# Patient Record
Sex: Female | Born: 1935 | Race: Black or African American | Hispanic: No | Smoking: Former smoker
Health system: Southern US, Community
[De-identification: ages and names within clinical notes are randomized; demographics above are authoritative.]

## PROBLEM LIST (undated history)

## (undated) DIAGNOSIS — R5383 Other fatigue: Secondary | ICD-10-CM

## (undated) DIAGNOSIS — I10 Essential (primary) hypertension: Secondary | ICD-10-CM

## (undated) DIAGNOSIS — N189 Chronic kidney disease, unspecified: Secondary | ICD-10-CM

## (undated) DIAGNOSIS — Z90710 Acquired absence of both cervix and uterus: Secondary | ICD-10-CM

## (undated) DIAGNOSIS — E785 Hyperlipidemia, unspecified: Secondary | ICD-10-CM

## (undated) DIAGNOSIS — Z9289 Personal history of other medical treatment: Secondary | ICD-10-CM

## (undated) DIAGNOSIS — C50919 Malignant neoplasm of unspecified site of unspecified female breast: Secondary | ICD-10-CM

## (undated) DIAGNOSIS — R001 Bradycardia, unspecified: Secondary | ICD-10-CM

## (undated) DIAGNOSIS — F418 Other specified anxiety disorders: Secondary | ICD-10-CM

## (undated) DIAGNOSIS — K219 Gastro-esophageal reflux disease without esophagitis: Secondary | ICD-10-CM

## (undated) DIAGNOSIS — K5790 Diverticulosis of intestine, part unspecified, without perforation or abscess without bleeding: Secondary | ICD-10-CM

## (undated) HISTORY — DX: Bradycardia, unspecified: R00.1

## (undated) HISTORY — DX: Other fatigue: R53.83

## (undated) HISTORY — DX: Other specified anxiety disorders: F41.8

## (undated) HISTORY — DX: Hyperlipidemia, unspecified: E78.5

## (undated) HISTORY — DX: Essential (primary) hypertension: I10

## (undated) HISTORY — DX: Diverticulosis of intestine, part unspecified, without perforation or abscess without bleeding: K57.90

## (undated) HISTORY — DX: Personal history of other medical treatment: Z92.89

## (undated) HISTORY — PX: MASTECTOMY: SHX3

## (undated) HISTORY — DX: Gastro-esophageal reflux disease without esophagitis: K21.9

## (undated) HISTORY — DX: Acquired absence of both cervix and uterus: Z90.710

## (undated) HISTORY — DX: Malignant neoplasm of unspecified site of unspecified female breast: C50.919

## (undated) HISTORY — DX: Chronic kidney disease, unspecified: N18.9

---

## 2009-05-10 ENCOUNTER — Ambulatory Visit: Payer: Self-pay | Admitting: Internal Medicine

## 2009-05-10 DIAGNOSIS — Z8719 Personal history of other diseases of the digestive system: Secondary | ICD-10-CM

## 2009-05-10 DIAGNOSIS — E785 Hyperlipidemia, unspecified: Secondary | ICD-10-CM

## 2009-05-10 DIAGNOSIS — Z9189 Other specified personal risk factors, not elsewhere classified: Secondary | ICD-10-CM | POA: Insufficient documentation

## 2009-05-10 DIAGNOSIS — G43909 Migraine, unspecified, not intractable, without status migrainosus: Secondary | ICD-10-CM | POA: Insufficient documentation

## 2009-05-10 DIAGNOSIS — I10 Essential (primary) hypertension: Secondary | ICD-10-CM | POA: Insufficient documentation

## 2009-05-10 DIAGNOSIS — F329 Major depressive disorder, single episode, unspecified: Secondary | ICD-10-CM

## 2009-05-10 DIAGNOSIS — Z8669 Personal history of other diseases of the nervous system and sense organs: Secondary | ICD-10-CM | POA: Insufficient documentation

## 2009-05-10 DIAGNOSIS — K219 Gastro-esophageal reflux disease without esophagitis: Secondary | ICD-10-CM | POA: Insufficient documentation

## 2009-05-10 DIAGNOSIS — Z853 Personal history of malignant neoplasm of breast: Secondary | ICD-10-CM

## 2009-05-10 DIAGNOSIS — Z87448 Personal history of other diseases of urinary system: Secondary | ICD-10-CM

## 2009-05-10 DIAGNOSIS — N189 Chronic kidney disease, unspecified: Secondary | ICD-10-CM | POA: Insufficient documentation

## 2009-05-10 LAB — CONVERTED CEMR LAB
BUN: 34 mg/dL — ABNORMAL HIGH (ref 6–23)
Cholesterol: 195 mg/dL (ref 0–200)
Creatinine, Ser: 1.4 mg/dL — ABNORMAL HIGH (ref 0.4–1.2)
Eosinophils Relative: 2 % (ref 0.0–5.0)
GFR calc non Af Amer: 46.17 mL/min (ref >60)
HCT: 35.9 % — ABNORMAL LOW (ref 36.0–46.0)
LDL Cholesterol: 106 mg/dL — ABNORMAL HIGH (ref 0–99)
Monocytes Relative: 5.8 % (ref 3.0–12.0)
Neutrophils Relative %: 53.1 % (ref 43.0–77.0)
Platelets: 224 10*3/uL (ref 150.0–400.0)
Potassium: 3.4 meq/L — ABNORMAL LOW (ref 3.5–5.1)
RBC: 3.62 M/uL — ABNORMAL LOW (ref 3.87–5.11)
TSH: 1.07 microintl units/mL (ref 0.35–5.50)
Total CHOL/HDL Ratio: 3
VLDL: 10.8 mg/dL (ref 0.0–40.0)
WBC: 6.1 10*3/uL (ref 4.5–10.5)

## 2009-09-07 ENCOUNTER — Ambulatory Visit: Payer: Self-pay | Admitting: Internal Medicine

## 2009-12-01 ENCOUNTER — Telehealth: Payer: Self-pay | Admitting: Internal Medicine

## 2010-01-05 ENCOUNTER — Ambulatory Visit: Payer: Self-pay | Admitting: Internal Medicine

## 2010-02-07 ENCOUNTER — Ambulatory Visit: Payer: Self-pay | Admitting: Internal Medicine

## 2010-07-19 NOTE — Assessment & Plan Note (Signed)
Summary: NEW / MEDICARE / # CD--switch ok'd/cd   Vital Signs:  Patient profile:   77 year old female Height:      63.5 inches (161.29 cm) Weight:      136 pounds (61.82 kg) BMI:     23.80 O2 Sat:      96 % on Room air Temp:     98.2 degrees F (36.78 degrees C) oral Pulse rate:   62 / minute Pulse rhythm:   regular Resp:     16 per minute BP sitting:   140 / 70  (left arm) Cuff size:   regular  Vitals Entered By: Lucious Groves CMA (February 07, 2010 3:00 PM)  O2 Flow:  Room air CC: New to est./kb Is Patient Diabetic? No Comments Patient denies taking Sertraline./kb   Primary Care Provider:  Etta Grandchild MD  CC:  New to est./kb.  History of Present Illness:  Hypertension Follow-Up      This is an 77 year old woman who presents for Hypertension follow-up.  The patient denies lightheadedness, urinary frequency, headaches, edema, rash, and fatigue.  The patient denies the following associated symptoms: chest pain, chest pressure, exercise intolerance, dyspnea, palpitations, syncope, leg edema, and pedal edema.  Compliance with medications (by patient report) has been near 100%.  The patient reports that dietary compliance has been good.  The patient reports no exercise.  Adjunctive measures currently used by the patient include salt restriction and relaxation.  She saw Dr. Hyman Hopes her kidney doctor last week and she was told that all is ok.  Preventive Screening-Counseling & Management  Alcohol-Tobacco     Alcohol drinks/day: 0     Alcohol Counseling: not indicated; patient does not drink     Smoking Status: quit     Tobacco Counseling: to remain off tobacco products  Hep-HIV-STD-Contraception     Hepatitis Risk: no risk noted     HIV Risk: no risk noted     STD Risk: no risk noted  Current Medications (verified): 1)  Ativan 1 Mg Tabs (Lorazepam) .Marland Kitchen.. 1 By Mouth Two Times A Day As Needed 2)  Ferrous Sulfate 325 (65 Fe) Mg Tabs (Ferrous Sulfate) .... Take 1 By Mouth Qd 3)   Hydrochlorothiazide 25 Mg Tabs (Hydrochlorothiazide) .... Take 1 Po Qd 4)  Pravastatin Sodium 20 Mg Tabs (Pravastatin Sodium) .... Take 1 By Mouth Qd 5)  Gabapentin 600 Mg Tabs (Gabapentin) .... Take 1 Qid 6)  Omeprazole 20 Mg Cpdr (Omeprazole) .Marland Kitchen.. 1 By Mouth Once Daily 7)  Sertraline Hcl 25 Mg Tabs (Sertraline Hcl) .Marland Kitchen.. 1 By Mouth Once Daily  Allergies (verified): No Known Drug Allergies  Past History:  Past Medical History: Last updated: 01/05/2010 Breast cancer, hx of Depression/anxiety Diverticulitis, hx of GERD Hyperlipidemia Hypertension CKD -    physician roster- renal Hyman Hopes  Past Surgical History: Last updated: 05/10/2009 Hysterectomy  Family History: Last updated: 05/10/2009 Heart disease (mom) Family History Hypertension (mom)  Social History: Last updated: 05/10/2009 Former Smoker lives alone; has family in [place] area [facility] since fall 2009 no alcohol  Risk Factors: Alcohol Use: 0 (02/07/2010) Caffeine Use: 1-2 (05/10/2009) Exercise: no (05/10/2009)  Risk Factors: Smoking Status: quit (02/07/2010)  Social History: Hepatitis Risk:  no risk noted HIV Risk:  no risk noted STD Risk:  no risk noted  Review of Systems  The patient denies anorexia, fever, weight loss, weight gain, chest pain, peripheral edema, prolonged cough, headaches, hemoptysis, abdominal  pain, hematuria, suspicious skin lesions, difficulty walking, depression, enlarged lymph nodes, and angioedema.    Physical Exam  General:  alert, well-developed, well-nourished, and cooperative to examination.   slight stuttering quality to speech Head:  normocephalic, atraumatic, no abnormalities observed, and no abnormalities palpated.   Mouth:  pharynx pink and moist, no erythema, and no exudates.   Neck:  supple, full ROM, no masses, no thyromegaly, no JVD, no carotid bruits, no cervical lymphadenopathy, and no neck tenderness.   Lungs:  normal  respiratory effort, no intercostal retractions, no accessory muscle use, normal breath sounds, no dullness, no fremitus, no crackles, and no wheezes.   Heart:  normal rate, regular rhythm, no murmur, no gallop, no rub, and no JVD.   Abdomen:  soft, non-tender, normal bowel sounds, no distention, no masses, no guarding, no rigidity, no rebound tenderness, no abdominal hernia, no inguinal hernia, no hepatomegaly, and no splenomegaly.   Msk:  No deformity or scoliosis noted of thoracic or lumbar spine.   Pulses:  R and L carotid,radial,femoral,dorsalis pedis and posterior tibial pulses are full and equal bilaterally Extremities:  No clubbing, cyanosis, edema, or deformity noted with normal full range of motion of all joints.   Neurologic:  No cranial nerve deficits noted. Station and gait are normal. Plantar reflexes are down-going bilaterally. DTRs are symmetrical throughout. Sensory, motor and coordinative functions appear intact. Skin:  Intact without suspicious lesions or rashes Cervical Nodes:  no anterior cervical adenopathy and no posterior cervical adenopathy.   Psych:  Cognition and judgment appear intact. Alert and cooperative with normal attention span and concentration. No apparent delusions, illusions, hallucinations   Impression & Recommendations:  Problem # 1:  HYPERTENSION (ICD-401.9) Assessment Improved  Her updated medication list for this problem includes:    Hydrochlorothiazide 25 Mg Tabs (Hydrochlorothiazide) .Marland Kitchen... Take 1 po qd  BP today: 140/70 Prior BP: 130/74 (01/05/2010)  Labs Reviewed: K+: 3.4 (05/10/2009) Creat: : 1.4 (05/10/2009)   Chol: 195 (05/10/2009)   HDL: 77.80 (05/10/2009)   LDL: 106 (05/10/2009)   TG: 54.0 (05/10/2009)  Problem # 2:  CHRONIC KIDNEY DISEASE UNSPECIFIED (ICD-585.9) Assessment: Unchanged  Labs Reviewed: BUN: 34 (05/10/2009)   Cr: 1.4 (05/10/2009)    Hgb: 12.1 (05/10/2009)   Hct: 35.9 (05/10/2009)   Ca++: 9.5 (05/10/2009)     Problem #  3:  BREAST CANCER, HX OF (ICD-V10.3) Assessment: Unchanged  Orders: Radiology Referral (Radiology)  Complete Medication List: 1)  Ativan 1 Mg Tabs (Lorazepam) .Marland Kitchen.. 1 by mouth two times a day as needed 2)  Ferrous Sulfate 325 (65 Fe) Mg Tabs (Ferrous sulfate) .... Take 1 by mouth qd 3)  Hydrochlorothiazide 25 Mg Tabs (Hydrochlorothiazide) .... Take 1 po qd 4)  Pravastatin Sodium 20 Mg Tabs (Pravastatin sodium) .... Take 1 by mouth qd 5)  Gabapentin 600 Mg Tabs (Gabapentin) .... Take 1 qid 6)  Omeprazole 20 Mg Cpdr (Omeprazole) .Marland Kitchen.. 1 by mouth once daily 7)  Sertraline Hcl 25 Mg Tabs (Sertraline hcl) .Marland Kitchen.. 1 by mouth once daily  Patient Instructions: 1)  Please schedule a follow-up appointment in 3 months. 2)  It is important that you exercise regularly at least 20 minutes 5 times a week. If you develop chest pain, have severe difficulty breathing, or feel very tired , stop exercising immediately and seek medical attention. 3)  You need to lose weight. Consider a lower calorie diet and regular exercise.  4)  Schedule your mammogram. 5)  Check your Blood Pressure regularly. If it is  above 130/80: you should make an appointment.

## 2010-07-19 NOTE — Assessment & Plan Note (Signed)
Summary: 4 MOS F/U // #/ CD   Vital Signs:  Patient profile:   77 year old female Height:      63.5 inches (161.29 cm) Weight:      134.0 pounds (60.91 kg) O2 Sat:      95 % on Room air Temp:     98.4 degrees F (36.89 degrees C) oral Pulse rate:   70 / minute BP sitting:   130 / 70  (left arm) Cuff size:   regular  Vitals Entered By: Orlan Leavens (September 07, 2009 11:15 AM)  O2 Flow:  Room air CC: 4 month follow-up/ req refill son HCTZ and pravastatin Is Patient Diabetic? No Pain Assessment Patient in pain? no        Primary Care Provider:  Newt Lukes MD  CC:  4 month follow-up/ req refill son HCTZ and pravastatin.  History of Present Illness: here for followup-  1) CKD - also follows with dr. Hyman Hopes for same - no plans for HD or need or replacement tx in past reports compliance with ongoing medical treatment and no changes in medication dose or frequency. denies adverse side effects related to current therapy.   2) HTN -  reports compliance with ongoing medical treatment and no changes in medication dose or frequency. denies adverse side effects related to current therapy. meds have been managed by renal to date  3) dyslipdemia - reports compliance with ongoing medical treatment and no changes in medication dose or frequency. denies adverse side effects related to current therapy.   4)GERD - increase in burning symptoms - not on med tx but tried something w/o change in past - no dysphagia or pain swallow - no abd pain  6) depression -  takes ativan for same because "nerves makes me depressed if not on ativan" "other medicines don't help as much so i only take ativan" denies insomnia or feelings of sadness, no SI/HI   Current Medications (verified): 1)  Ferrous Sulfate 325 (65 Fe) Mg Tabs (Ferrous Sulfate) .... Take 1 By Mouth Qd 2)  Ativan 1 Mg Tabs (Lorazepam) .... Take 1 By Mouth Two Times A Day As Needed - 3)  Zemplar 1 Mcg Caps (Paricalcitol) .... Take  1 Mon, Wed,and Fri 4)  Hydrochlorothiazide 25 Mg Tabs (Hydrochlorothiazide) .... Take 1 Po Qd 5)  Pravastatin Sodium 20 Mg Tabs (Pravastatin Sodium) .... Take 1 By Mouth Qd 6)  Gabapentin 600 Mg Tabs (Gabapentin) .... Take 1 Qid  Allergies (verified): No Known Drug Allergies  Past History:  Past Medical History: Breast cancer, hx of Depression Diverticulitis, hx of GERD Hyperlipidemia Hypertension CKD -   physician rooster- renal - Hyman Hopes  Review of Systems  The patient denies fever, hoarseness, chest pain, prolonged cough, headaches, and abdominal pain.    Physical Exam  General:  alert, well-developed, well-nourished, and cooperative to examination.   slight stuttering quality to speech Lungs:  normal respiratory effort, no intercostal retractions or use of accessory muscles; normal breath sounds bilaterally - no crackles and no wheezes.    Heart:  normal rate, regular rhythm, no murmur, and no rub. BLE without edema.  Neurologic:  alert & oriented X3 and cranial nerves II-XII symetrically intact.  strength normal in all extremities, sensation intact to light touch, and gait normal. speech fluent without dysarthria or aphasia; follows commands with good comprehension.  Psych:  Oriented X3, memory intact for recent and remote, normally interactive, good eye contact, not anxious appearing, not depressed appearing, and  not agitated.      Impression & Recommendations:  Problem # 1:  GERD (ICD-530.81)  start new PPI tx for same - reassured exam looks good today Her updated medication list for this problem includes:    Omeprazole 20 Mg Cpdr (Omeprazole) .Marland Kitchen... 1 by mouth once daily  Labs Reviewed: Hgb: 12.1 (05/10/2009)   Hct: 35.9 (05/10/2009)  Orders: Prescription Created Electronically [phone])  Problem # 2:  CHRONIC KIDNEY DISEASE UNSPECIFIED (ICD-585.9)  followed by renal for same - check labs - cont zeplar and f/u with dr. Hyman Hopes -   Labs Reviewed: BUN: 34  (05/10/2009)   Cr: 1.4 (05/10/2009)    Hgb: 12.1 (05/10/2009)   Hct: 35.9 (05/10/2009)   Ca++: 9.5 (05/10/2009)     Problem # 3:  HYPERTENSION (ICD-401.9)  Her updated medication list for this problem includes:    Hydrochlorothiazide 25 Mg Tabs (Hydrochlorothiazide) .Marland Kitchen... Take 1 po qd  BP today: 130/70 Prior BP: 132/72 (05/10/2009)  Labs Reviewed: K+: 3.4 (05/10/2009) Creat: : 1.4 (05/10/2009)   Chol: 195 (05/10/2009)   HDL: 77.80 (05/10/2009)   LDL: 106 (05/10/2009)   TG: 54.0 (05/10/2009)  Problem # 4:  HYPERLIPIDEMIA (ICD-272.4)  Her updated medication list for this problem includes:    Pravastatin Sodium 20 Mg Tabs (Pravastatin sodium) .Marland Kitchen... Take 1 by mouth qd    HDL:77.80 (05/10/2009)  LDL:106 (05/10/2009)  Chol:195 (05/10/2009)  Trig:54.0 (05/10/2009)  Problem # 5:  DEPRESSION (ICD-311)  The following medications were removed from the medication list:    Ativan 1 Mg Tabs (Lorazepam) .Marland Kitchen... Take 1 by mouth two times a day as needed - Her updated medication list for this problem includes:    Ativan 1 Mg Tabs (Lorazepam) .Marland Kitchen... 1 by mouth two times a day as needed  Complete Medication List: 1)  Ativan 1 Mg Tabs (Lorazepam) .Marland Kitchen.. 1 by mouth two times a day as needed 2)  Ferrous Sulfate 325 (65 Fe) Mg Tabs (Ferrous sulfate) .... Take 1 by mouth qd 3)  Zemplar 1 Mcg Caps (Paricalcitol) .... Take 1 mon, wed,and fri 4)  Hydrochlorothiazide 25 Mg Tabs (Hydrochlorothiazide) .... Take 1 po qd 5)  Pravastatin Sodium 20 Mg Tabs (Pravastatin sodium) .... Take 1 by mouth qd 6)  Gabapentin 600 Mg Tabs (Gabapentin) .... Take 1 qid 7)  Omeprazole 20 Mg Cpdr (Omeprazole) .Marland Kitchen.. 1 by mouth once daily  Patient Instructions: 1)  it was good to see you today.  2)  will start medication for your reflux symptoms - omeprazole 3)  your prescriptions and refills have been electronically submitted to your pharmacy. Please take as directed. Contact our office if you believe you're having problems with  the medication(s).  4)  Please schedule a follow-up appointment in 4 months to monitor labs, sooner if problems.  Prescriptions: ATIVAN 1 MG TABS (LORAZEPAM) take 1 by mouth two times a day as needed -  #60 x 3   Entered and Authorized by:   Newt Lukes MD   Signed by:   Newt Lukes MD on 09/07/2009   Method used:   Print then Give to Patient   RxID:   [id number] OMEPRAZOLE 20 MG CPDR (OMEPRAZOLE) 1 by mouth once daily  #30 x 3   Entered and Authorized by:   Newt Lukes MD   Signed by:   Newt Lukes MD on 09/07/2009   Method used:   Electronically to        [facility] #[id number]* (retail)  [address]       [place], [place]  [address]       Ph: [phone]       Fax: [phone]   RxID:   [id number] PRAVASTATIN SODIUM 20 MG TABS (PRAVASTATIN SODIUM) take 1 by mouth qd  #30 x 6   Entered by:   Orlan Leavens   Authorized by:   Newt Lukes MD   Signed by:   Orlan Leavens on 09/07/2009   Method used:   Electronically to        [facility] #[id number]* (retail)       [address] [place], [place]  [address]       Ph: [phone]       Fax: [phone]   RxID:   [id number] HYDROCHLOROTHIAZIDE 25 MG TABS (HYDROCHLOROTHIAZIDE) take 1 po qd  #30 x 6   Entered by:   Orlan Leavens   Authorized by:   Newt Lukes MD   Signed by:   Orlan Leavens on 09/07/2009   Method used:   Electronically to        [facility] #[id number]* (retail)       [address] [place], [place]  [address]       Ph: [phone]       Fax: [phone]   RxID:   [id number]

## 2010-07-19 NOTE — Assessment & Plan Note (Signed)
Summary: 4 MTH FU--STC   Vital Signs:  Patient profile:   77 year old female Height:      63.5 inches (161.29 cm) Weight:      136.2 pounds (61.91 kg) O2 Sat:      95 % on Room air Temp:     98.2 degrees F (36.78 degrees C) oral Pulse rate:   73 / minute BP sitting:   130 / 74  (left arm) Cuff size:   regular  Vitals Entered By: Orlan Leavens (January 05, 2010 11:10 AM)  O2 Flow:  Room air CC: 4 month follow-up Is Patient Diabetic? No Pain Assessment Patient in pain? no      Comments Req refill on omeprazole   Primary Care Provider:  Newt Lukes MD  CC:  4 month follow-up.  History of Present Illness: here for followup-  1) CKD - also follows with dr. Hyman Hopes for same - no plans for HD or need or replacement tx in past reports compliance with ongoing medical treatment and no changes in medication dose or frequency. denies adverse side effects related to current therapy.   2) HTN -  reports compliance with ongoing medical treatment and no changes in medication dose or frequency. denies adverse side effects related to current therapy. meds have been managed by renal to date  3) dyslipdemia - reports compliance with ongoing medical treatment and no changes in medication dose or frequency. denies adverse side effects related to current therapy.   4) GERD - improved burning symptoms on PPI - no dysphagia or pain swallow - no abd pain  6) depression/anxiety - takes ativan for same because "nerves makes me depressed if not on ativan" "other medicines don't help as much so i only take ativan" denies insomnia or feelings of sadness, no SI/HI - has been in mental health hospital for same wants brand name only because generic doesn't work -  no local mental health provider yet est but followed reg with same in [place]  Clinical Review Panels:  Lipid Management   Cholesterol:  195 (05/10/2009)   LDL (bad choesterol):  106 (05/10/2009)   HDL (good cholesterol):  77.80  (05/10/2009)  CBC   WBC:  6.1 (05/10/2009)   RBC:  3.62 (05/10/2009)   Hgb:  12.1 (05/10/2009)   Hct:  35.9 (05/10/2009)   Platelets:  224.0 (05/10/2009)   MCV  99.2 (05/10/2009)   MCHC  33.7 (05/10/2009)   RDW  12.4 (05/10/2009)   PMN:  53.1 (05/10/2009)   Lymphs:  38.1 (05/10/2009)   Monos:  5.8 (05/10/2009)   Eosinophils:  2.0 (05/10/2009)   Basophil:  1.0 (05/10/2009)  Complete Metabolic Panel   Glucose:  103 (05/10/2009)   Sodium:  141 (05/10/2009)   Potassium:  3.4 (05/10/2009)   Chloride:  100 (05/10/2009)   CO2:  32 (05/10/2009)   BUN:  34 (05/10/2009)   Creatinine:  1.4 (05/10/2009)   Calcium:  9.5 (05/10/2009)   Current Medications (verified): 1)  Ativan 1 Mg Tabs (Lorazepam) .Marland Kitchen.. 1 By Mouth Two Times A Day As Needed 2)  Ferrous Sulfate 325 (65 Fe) Mg Tabs (Ferrous Sulfate) .... Take 1 By Mouth Qd 3)  Hydrochlorothiazide 25 Mg Tabs (Hydrochlorothiazide) .... Take 1 Po Qd 4)  Pravastatin Sodium 20 Mg Tabs (Pravastatin Sodium) .... Take 1 By Mouth Qd 5)  Gabapentin 600 Mg Tabs (Gabapentin) .... Take 1 Qid 6)  Omeprazole 20 Mg Cpdr (Omeprazole) .Marland Kitchen.. 1 By Mouth Once Daily  Allergies (verified): No  Known Drug Allergies  Past History:  Past Medical History: Breast cancer, hx of Depression/anxiety Diverticulitis, hx of GERD Hyperlipidemia Hypertension CKD -    physician roster- renal - Hyman Hopes  Review of Systems  The patient denies fever, weight loss, hoarseness, chest pain, syncope, and abdominal pain.    Physical Exam  General:  alert, well-developed, well-nourished, and cooperative to examination.   slight stuttering quality to speech Lungs:  normal respiratory effort, no intercostal retractions or use of accessory muscles; normal breath sounds bilaterally - no crackles and no wheezes.    Heart:  normal rate, regular rhythm, no murmur, and no rub. BLE without edema.  Psych:  Oriented X3, memory intact for recent and remote, normally interactive, good  eye contact, not anxious appearing, not depressed appearing, and not agitated.      Impression & Recommendations:  Problem # 1:  DEPRESSION (ICD-311)  Her updated medication list for this problem includes:    Ativan 1 Mg Tabs (Lorazepam) .Marland Kitchen... 1 by mouth two times a day as needed    Sertraline Hcl 25 Mg Tabs (Sertraline hcl) .Marland Kitchen... 1 by mouth once daily  will prescribe brand name as per pt request despite arguement that i believe generic works fine - given one month and 2 refills and DAW until pt est with mental health local provider  Orders: Psychiatric Referral (Psych)  Problem # 2:  HYPERTENSION (ICD-401.9)  Her updated medication list for this problem includes:    Hydrochlorothiazide 25 Mg Tabs (Hydrochlorothiazide) .Marland Kitchen... Take 1 po qd  BP today: 130/74 Prior BP: 130/70 (09/07/2009)  Labs Reviewed: K+: 3.4 (05/10/2009) Creat: : 1.4 (05/10/2009)   Chol: 195 (05/10/2009)   HDL: 77.80 (05/10/2009)   LDL: 106 (05/10/2009)   TG: 54.0 (05/10/2009)  Problem # 3:  HYPERLIPIDEMIA (ICD-272.4)  Her updated medication list for this problem includes:    Pravastatin Sodium 20 Mg Tabs (Pravastatin sodium) .Marland Kitchen... Take 1 by mouth qd    HDL:77.80 (05/10/2009)  LDL:106 (05/10/2009)  Chol:195 (05/10/2009)  Trig:54.0 (05/10/2009)  Problem # 4:  CHRONIC KIDNEY DISEASE UNSPECIFIED (ICD-585.9)  followed by renal for same - check labs - cont zeplar and f/u with dr. Hyman Hopes -   Labs Reviewed: BUN: 34 (05/10/2009)   Cr: 1.4 (05/10/2009)    Hgb: 12.1 (05/10/2009)   Hct: 35.9 (05/10/2009)   Ca++: 9.5 (05/10/2009)     Problem # 5:  GERD (ICD-530.81)  Her updated medication list for this problem includes:    Omeprazole 20 Mg Cpdr (Omeprazole) .Marland Kitchen... 1 by mouth once daily  Labs Reviewed: Hgb: 12.1 (05/10/2009)   Hct: 35.9 (05/10/2009)  cont PPI tx for same - reassured exam looks good today  Complete Medication List: 1)  Ativan 1 Mg Tabs (Lorazepam) .Marland Kitchen.. 1 by mouth two times a day as needed 2)   Ferrous Sulfate 325 (65 Fe) Mg Tabs (Ferrous sulfate) .... Take 1 by mouth qd 3)  Hydrochlorothiazide 25 Mg Tabs (Hydrochlorothiazide) .... Take 1 po qd 4)  Pravastatin Sodium 20 Mg Tabs (Pravastatin sodium) .... Take 1 by mouth qd 5)  Gabapentin 600 Mg Tabs (Gabapentin) .... Take 1 qid 6)  Omeprazole 20 Mg Cpdr (Omeprazole) .Marland Kitchen.. 1 by mouth once daily 7)  Sertraline Hcl 25 Mg Tabs (Sertraline hcl) .Marland Kitchen.. 1 by mouth once daily  Patient Instructions: 1)  it was good to see you today.  2)  continue medication for your reflux symptoms - omeprazole 3)  presciption for brand name ativan given -  4)  also  start sertraline for anxiety 5)  your prescriptions have been electronically submitted to your pharmacy. Please take as directed. Contact our office if you believe you're having problems with the medication(s).  6)  we'll make referral to local mental health provider. Our office will contact you regarding this appointment once made.  7)  Please schedule a follow-up appointment in 4 months to monitor labs, sooner if problems.  Prescriptions: SERTRALINE HCL 25 MG TABS (SERTRALINE HCL) 1 by mouth once daily  #30 x 3   Entered and Authorized by:   Newt Lukes MD   Signed by:   Newt Lukes MD on 01/05/2010   Method used:   Electronically to        [facility] #[id number]* (retail)       [address] [place], [place]  [address]       Ph: [phone]       Fax: [phone]   RxID:   [id number] ATIVAN 1 MG TABS (LORAZEPAM) 1 by mouth two times a day as needed Brand medically necessary #60 x 2   Entered and Authorized by:   Newt Lukes MD   Signed by:   Newt Lukes MD on 01/05/2010   Method used:   Print then Give to Patient   RxID:   [id number] OMEPRAZOLE 20 MG CPDR (OMEPRAZOLE) 1 by mouth once daily  #30 x 3   Entered and Authorized by:   Newt Lukes MD   Signed by:   Newt Lukes MD on 01/05/2010   Method used:   Electronically to          [facility] #[id number]* (retail)       [address] [place], [place]  [address]       Ph: [phone]       Fax: [phone]   RxID:   [id number]

## 2010-07-19 NOTE — Progress Notes (Signed)
Summary: Lorazepam BMN?  Phone Note From Pharmacy   Caller: [facility] #[id number]* Summary of Call: Pharmacy called stating that pt is requesting Brand Name Lorazepam, which would require Medicare to pay for medication. If MD okays, hard copy stating "Brand Name Medically Necessary" on hard copy to be faxed to pharmacy. Initial call taken by: Margaret Pyle, CMA,  December 01, 2009 2:42 PM  Follow-up for Phone Call        no - Follow-up by: Newt Lukes MD,  December 01, 2009 4:12 PM  Additional Follow-up for Phone Call Additional follow up Details #1::        pharmacist Dahlia Client informed Additional Follow-up by: Margaret Pyle, CMA,  December 01, 2009 4:47 PM

## 2010-07-19 NOTE — Miscellaneous (Signed)
Summary: [facility]   Imported By: Lester [place] 09/17/2009 08:04:26  _____________________________________________________________________  External Attachment:    Type:   Image     Comment:   External Document

## 2011-02-21 ENCOUNTER — Encounter: Payer: Self-pay | Admitting: Cardiovascular Disease

## 2011-02-28 ENCOUNTER — Encounter: Payer: Self-pay | Admitting: Cardiovascular Disease

## 2011-03-20 ENCOUNTER — Encounter: Payer: Self-pay | Admitting: Cardiovascular Disease

## 2011-03-22 ENCOUNTER — Ambulatory Visit: Payer: Self-pay | Admitting: Cardiovascular Disease

## 2011-04-05 ENCOUNTER — Ambulatory Visit (INDEPENDENT_AMBULATORY_CARE_PROVIDER_SITE_OTHER): Payer: Medicare Other | Admitting: Cardiovascular Disease

## 2011-04-05 ENCOUNTER — Encounter: Payer: Self-pay | Admitting: Cardiovascular Disease

## 2011-04-05 VITALS — BP 140/73 | HR 77 | Ht 60.0 in | Wt 134.0 lb

## 2011-04-05 DIAGNOSIS — R001 Bradycardia, unspecified: Secondary | ICD-10-CM

## 2011-04-05 DIAGNOSIS — I498 Other specified cardiac arrhythmias: Secondary | ICD-10-CM

## 2011-04-05 NOTE — Patient Instructions (Signed)
Your physician has recommended that you wear a 24 holter monitor. Holter monitors are medical devices that record the heart's electrical activity. Doctors most often use these monitors to diagnose arrhythmias. Arrhythmias are problems with the speed or rhythm of the heartbeat. The monitor is a small, portable device. You can wear one while you do your normal daily activities. This is usually used to diagnose what is causing palpitations/syncope (passing out).  Your physician recommends that you continue on your current medications as directed. Please refer to the Current Medication list given to you today.  Your physician recommends that you schedule a follow-up appointment as needed.

## 2011-04-07 ENCOUNTER — Telehealth: Payer: Self-pay | Admitting: Cardiovascular Disease

## 2011-04-07 NOTE — Telephone Encounter (Signed)
Order was placed for holter monitor.  I will forward this message to Windell Moulding to contact the pt about appointment.

## 2011-04-07 NOTE — Telephone Encounter (Signed)
Pt calling to speak w/ nurse about some dates when pt can come in monitor, pt thinks. Please return pt call to clarify/discuss further.

## 2011-04-12 ENCOUNTER — Encounter (INDEPENDENT_AMBULATORY_CARE_PROVIDER_SITE_OTHER): Payer: Medicare Other

## 2011-04-12 DIAGNOSIS — R001 Bradycardia, unspecified: Secondary | ICD-10-CM

## 2011-04-12 DIAGNOSIS — I498 Other specified cardiac arrhythmias: Secondary | ICD-10-CM

## 2011-04-19 DIAGNOSIS — R001 Bradycardia, unspecified: Secondary | ICD-10-CM | POA: Insufficient documentation

## 2011-04-19 NOTE — Assessment & Plan Note (Signed)
Your. Her generalized fatigue is probably multifactorial. However, she did have marked bradycardia noted that her previous office visit and I think it is reasonable to pursue a 24-hour Holter monitor to evaluate for arrhythmia or significant bradycardic events. Her physical exam is essentially normal and there is no sign of structural heart disease. Laboratory data was reviewed and she's had a recent TSH which was within normal limits.  I will review her Holter monitor when available and likely will see her back on an as needed basis unless the Holter monitor demonstrates significant arrhythmia.

## 2011-04-19 NOTE — Progress Notes (Signed)
HPI:  This is an 77 year old woman presenting for evaluation of bradycardia and fatigue. The patient is followed by Dr. Hyman Hopes for chronic kidney disease. Her creatinine has been stable in the range of 1.3-1.5 mg per deciliter. At the time of her evaluation last month she was noted to have a heart rate of 48. She has complained of generalized fatigue slowly progressive over the last few years. She denies palpitations, lightheadedness, or syncope. The patient has chronic left-sided chest pain that she relates to postherpetic neuralgia and she has been dealing with this for over 30 years. She has no exertional chest tightness. She denies dyspnea, edema, orthopnea, or PND.  Outpatient Encounter Prescriptions as of 04/05/2011  Medication Sig Dispense Refill  . amLODipine (NORVASC) 2.5 MG tablet Take 2.5 mg by mouth daily.        Marland Kitchen gabapentin (NEURONTIN) 600 MG tablet Take 600 mg by mouth 4 (four) times daily.        . hydrochlorothiazide (HYDRODIURIL) 25 MG tablet Take 25 mg by mouth daily.        Marland Kitchen LORazepam (ATIVAN) 1 MG tablet Take 1 mg by mouth every 6 (six) hours as needed.        Marland Kitchen omeprazole (PRILOSEC OTC) 20 MG tablet Take 20 mg by mouth daily.        . rosuvastatin (CRESTOR) 20 MG tablet Take 20 mg by mouth daily.        Marland Kitchen DISCONTD: ferrous sulfate 325 (65 FE) MG tablet Take 325 mg by mouth daily with breakfast.        . DISCONTD: pravastatin (PRAVACHOL) 20 MG tablet Take 20 mg by mouth daily.        Marland Kitchen DISCONTD: sertraline (ZOLOFT) 25 MG tablet Take 25 mg by mouth daily.          Review of patient's allergies indicates no known allergies.  Past Medical History  Diagnosis Date  . Breast cancer     hx of  . Depression with anxiety   . Diverticulosis     hx of  . GERD (gastroesophageal reflux disease)   . Hyperlipidemia   . Hypertension   . CKD (chronic kidney disease)   . H/O: hysterectomy   . Bradycardia   . Fatigue     No past surgical history on file.  History   Social  History  . Marital Status: Widowed    Spouse Name: N/A    Number of Children: N/A  . Years of Education: N/A   Occupational History  . Not on file.   Social History Main Topics  . Smoking status: Former Games developer  . Smokeless tobacco: Not on file  . Alcohol Use: No  . Drug Use: Not on file  . Sexually Active: Not on file   Other Topics Concern  . Not on file   Social History Narrative  . No narrative on file    Family History  Problem Relation Age of Onset  . Coronary artery disease Mother     family hx of  . Hypertension Mother     hx of    ROS: General: no fevers/chills/night sweats. Positive for generalized fatigue Eyes: no blurry vision, diplopia, or amaurosis ENT: no sore throat or hearing loss Resp: no cough, wheezing, or hemoptysis CV: no edema or palpitations GI: no abdominal pain, nausea, vomiting, diarrhea, or constipation GU: no dysuria, frequency, or hematuria Skin: no rash Neuro: no headache, numbness, tingling, or weakness of extremities Musculoskeletal: no joint pain or  swelling. Positive for chronic burning pain along the left lateral chest present for many years Heme: no bleeding, DVT, or easy bruising Endo: no polydipsia or polyuria  BP 140/73  Pulse 77  Ht 5' (1.524 m)  Wt 134 lb (60.782 kg)  BMI 26.17 kg/m2  PHYSICAL EXAM: Pt is alert and oriented, WD, WN, elderly woman in no distress. HEENT: normal Neck: JVP normal. Carotid upstrokes normal without bruits. No thyromegaly. Lungs: equal expansion, clear bilaterally CV: Apex is discrete and nondisplaced, RRR without murmur or gallop Abd: soft, NT, +BS, no bruit, no hepatosplenomegaly Back: no CVA tenderness Ext: no C/C/E        Femoral pulses 2+= without bruits        DP/PT pulses intact and = Skin: warm and dry without rash Neuro: CNII-XII intact             Strength intact = bilaterally  EKG:  Normal sinus rhythm with rare premature atrial contractions, left axis deviation, moderate  voltage criteria for LVH maybe normal variant  ASSESSMENT AND PLAN:

## 2011-05-16 ENCOUNTER — Telehealth: Payer: Self-pay

## 2011-05-16 NOTE — Telephone Encounter (Signed)
I mailed a letter to the pt to contact our office about the results of her cardiac monitor.    Monitor impression per Dr Excell Seltzer: Sinus Rhythm with occasional PVCs. No sustained arrhythmia.  Minimum Heart rate 61. Average heart rate 75.

## 2011-05-17 ENCOUNTER — Telehealth: Payer: Self-pay | Admitting: Cardiovascular Disease

## 2011-05-17 NOTE — Telephone Encounter (Signed)
Fu call Pt calling about test results

## 2011-05-17 NOTE — Telephone Encounter (Signed)
Holter monitor results given to patient. Pt. verbalized understanding. Patient received the Holter monitor result mailed to her . She said that the ph # used  was  a wrong phone number.

## 2011-07-03 ENCOUNTER — Ambulatory Visit (INDEPENDENT_AMBULATORY_CARE_PROVIDER_SITE_OTHER): Payer: Medicare HMO

## 2011-07-03 DIAGNOSIS — Z23 Encounter for immunization: Secondary | ICD-10-CM

## 2011-07-03 DIAGNOSIS — E782 Mixed hyperlipidemia: Secondary | ICD-10-CM

## 2011-07-03 DIAGNOSIS — F411 Generalized anxiety disorder: Secondary | ICD-10-CM

## 2011-07-03 DIAGNOSIS — I10 Essential (primary) hypertension: Secondary | ICD-10-CM

## 2012-01-18 ENCOUNTER — Other Ambulatory Visit: Payer: Self-pay | Admitting: Family Medicine

## 2012-01-18 MED ORDER — LORAZEPAM 1 MG PO TABS: ORAL_TABLET | ORAL | Status: DC

## 2012-04-25 ENCOUNTER — Ambulatory Visit: Payer: Self-pay | Admitting: Internal Medicine

## 2012-06-15 ENCOUNTER — Other Ambulatory Visit: Payer: Self-pay | Admitting: Family Medicine

## 2012-06-16 NOTE — Telephone Encounter (Signed)
Please pull chart.

## 2012-06-17 NOTE — Telephone Encounter (Signed)
Chart pulled to PA pool at nurses station [phone]

## 2012-07-10 ENCOUNTER — Other Ambulatory Visit: Payer: Self-pay | Admitting: Family Medicine

## 2012-08-16 ENCOUNTER — Telehealth: Payer: Self-pay | Admitting: Radiology

## 2012-08-16 MED ORDER — HYDROCHLOROTHIAZIDE 25 MG PO TABS: 25.0000 mg | ORAL_TABLET | Freq: Every day | ORAL | Status: DC

## 2012-08-16 NOTE — Telephone Encounter (Signed)
Patient is coming here for her med refills/ she has gone to [facility], now she wants to come here. To you FYI

## 2012-08-17 ENCOUNTER — Ambulatory Visit (INDEPENDENT_AMBULATORY_CARE_PROVIDER_SITE_OTHER): Payer: Medicare Other | Admitting: Internal Medicine

## 2012-08-17 VITALS — BP 155/69 | HR 60 | Temp 98.0°F | Resp 16 | Ht 60.0 in | Wt 135.6 lb

## 2012-08-17 DIAGNOSIS — I10 Essential (primary) hypertension: Secondary | ICD-10-CM

## 2012-08-17 LAB — POCT UA - MICROSCOPIC ONLY: Mucus, UA: NEGATIVE

## 2012-08-17 LAB — POCT CBC
Granulocyte percent: 65.4 %G (ref 37–80)
HCT, POC: 41 % (ref 37.7–47.9)
Hemoglobin: 12.9 g/dL (ref 12.2–16.2)
Lymph, poc: 2.3 (ref 0.6–3.4)
MCHC: 31.5 g/dL — AB (ref 31.8–35.4)
MCV: 96.8 fL (ref 80–97)
POC Granulocyte: 5.4 (ref 2–6.9)

## 2012-08-17 LAB — POCT URINALYSIS DIPSTICK
Bilirubin, UA: NEGATIVE
Ketones, UA: NEGATIVE
Protein, UA: NEGATIVE
pH, UA: 5.5

## 2012-08-17 MED ORDER — OMEPRAZOLE MAGNESIUM 20 MG PO TBEC: 20.0000 mg | DELAYED_RELEASE_TABLET | Freq: Every day | ORAL | Status: DC

## 2012-08-17 MED ORDER — CHLORTHALIDONE 25 MG PO TABS: ORAL_TABLET | ORAL | Status: DC

## 2012-08-17 MED ORDER — ROSUVASTATIN CALCIUM 20 MG PO TABS: 20.0000 mg | ORAL_TABLET | Freq: Every day | ORAL | Status: DC

## 2012-08-17 MED ORDER — LORAZEPAM 0.5 MG PO TABS: 0.5000 mg | ORAL_TABLET | Freq: Two times a day (BID) | ORAL | Status: DC | PRN

## 2012-08-17 NOTE — Patient Instructions (Addendum)

## 2012-08-17 NOTE — Progress Notes (Signed)
  Subjective:    Patient ID: Kylie Allen, female    DOB: July 03, 1923, 79 y.o.   MRN: [id number]  HPI Has not been here in over 1 yr. Has seen cardiology multiple times recently, bradycardia observed but holter monitor was normal. Needs her meds rfed. HTN, lipid disorder, anxiety, PHN Former patient Dr. Lenord Allen and Kylie Allen   Review of Systems     Objective:   Physical Exam  Vitals reviewed. Constitutional: She is oriented to person, place, and time. She appears well-developed and well-nourished.  HENT:  Right Ear: External ear normal.  Left Ear: External ear normal.  Nose: Nose normal.  Mouth/Throat: Oropharynx is clear and moist.  Cardiovascular: Normal rate, regular rhythm and normal heart sounds.   Pulmonary/Chest: Effort normal. Not tachypneic. No respiratory distress. She has decreased breath sounds. She has no wheezes. She has rhonchi.  Neurological: She is alert and oriented to person, place, and time. She exhibits normal muscle tone. Coordination normal.  Skin: Skin is warm and dry.  Psychiatric: She has a normal mood and affect. Her behavior is normal.  Appears healthy and well  Results for orders placed in visit on 08/17/12  POCT CBC      Result Value Range   WBC 8.2  4.6 - 10.2 K/uL   Lymph, poc 2.3  0.6 - 3.4   POC LYMPH PERCENT 27.8  10 - 50 %L   MID (cbc) 0.6  0 - 0.9   POC MID % 6.8  0 - 12 %M   POC Granulocyte 5.4  2 - 6.9   Granulocyte percent 65.4  37 - 80 %G   RBC 4.24  4.04 - 5.48 M/uL   Hemoglobin 12.9  12.2 - 16.2 g/dL   HCT, POC 46.9  62.9 - 47.9 %   MCV 96.8  80 - 97 fL   MCH, POC 30.4  27 - 31.2 pg   MCHC 31.5 (*) 31.8 - 35.4 g/dL   RDW, POC 52.8     Platelet Count, POC 365  142 - 424 K/uL   MPV 8.5  0 - 99.8 fL  POCT UA - MICROSCOPIC ONLY      Result Value Range   WBC, Ur, HPF, POC 4-8     RBC, urine, microscopic 0-2     Bacteria, U Microscopic trace     Mucus, UA neg     Epithelial cells, urine per micros 0-2     Crystals, Ur, HPF,  POC neg     Casts, Ur, LPF, POC renal tubular     Yeast, UA neg    POCT URINALYSIS DIPSTICK      Result Value Range   Color, UA yellow     Clarity, UA clear     Glucose, UA neg     Bilirubin, UA neg     Ketones, UA neg     Spec Grav, UA 1.015     Blood, UA trace-lysed     pH, UA 5.5     Protein, UA neg     Urobilinogen, UA 0.2     Nitrite, UA neg     Leukocytes, UA small (1+)           Assessment & Plan:  Schedule CPE 104 Confusion by her about meds. Reck 2 weeks Finish cipro 250mg  for uti

## 2012-08-18 LAB — COMPREHENSIVE METABOLIC PANEL
BUN: 24 mg/dL — ABNORMAL HIGH (ref 6–23)
CO2: 28 mEq/L (ref 19–32)
Calcium: 10.2 mg/dL (ref 8.4–10.5)
Chloride: 97 mEq/L (ref 96–112)
Creat: 1.43 mg/dL — ABNORMAL HIGH (ref 0.50–1.10)
Total Bilirubin: 0.4 mg/dL (ref 0.3–1.2)

## 2012-08-18 LAB — LIPID PANEL
Cholesterol: 177 mg/dL (ref 0–200)
HDL: 72 mg/dL (ref >39)
Total CHOL/HDL Ratio: 2.5 Ratio
Triglycerides: 85 mg/dL (ref <150)
VLDL: 17 mg/dL (ref 0–40)

## 2012-08-18 NOTE — Telephone Encounter (Signed)
Pt came in yesterday

## 2012-08-20 ENCOUNTER — Other Ambulatory Visit: Payer: Self-pay | Admitting: Internal Medicine

## 2012-08-20 ENCOUNTER — Encounter: Payer: Self-pay | Admitting: Radiology

## 2012-08-20 NOTE — Telephone Encounter (Signed)
Per OV note 08/17/12, needs to f/u in 2 weeks for CPE at 104

## 2012-08-29 ENCOUNTER — Emergency Department (HOSPITAL_COMMUNITY)
Admission: EM | Admit: 2012-08-29 | Discharge: 2012-08-30 | Disposition: A | Discharge status: Home / Self Care | Payer: PRIVATE HEALTH INSURANCE | Attending: Emergency Medicine | Admitting: Emergency Medicine

## 2012-08-29 ENCOUNTER — Emergency Department (HOSPITAL_COMMUNITY): Payer: PRIVATE HEALTH INSURANCE

## 2012-08-29 ENCOUNTER — Encounter (HOSPITAL_COMMUNITY): Payer: Self-pay | Admitting: Emergency Medicine

## 2012-08-29 DIAGNOSIS — Z87891 Personal history of nicotine dependence: Secondary | ICD-10-CM | POA: Insufficient documentation

## 2012-08-29 DIAGNOSIS — R5381 Other malaise: Secondary | ICD-10-CM | POA: Insufficient documentation

## 2012-08-29 DIAGNOSIS — I129 Hypertensive chronic kidney disease with stage 1 through stage 4 chronic kidney disease, or unspecified chronic kidney disease: Secondary | ICD-10-CM | POA: Insufficient documentation

## 2012-08-29 DIAGNOSIS — R52 Pain, unspecified: Secondary | ICD-10-CM | POA: Insufficient documentation

## 2012-08-29 DIAGNOSIS — M7989 Other specified soft tissue disorders: Secondary | ICD-10-CM | POA: Insufficient documentation

## 2012-08-29 DIAGNOSIS — K219 Gastro-esophageal reflux disease without esophagitis: Secondary | ICD-10-CM | POA: Insufficient documentation

## 2012-08-29 DIAGNOSIS — R197 Diarrhea, unspecified: Secondary | ICD-10-CM | POA: Insufficient documentation

## 2012-08-29 DIAGNOSIS — Z853 Personal history of malignant neoplasm of breast: Secondary | ICD-10-CM | POA: Insufficient documentation

## 2012-08-29 DIAGNOSIS — N189 Chronic kidney disease, unspecified: Secondary | ICD-10-CM | POA: Insufficient documentation

## 2012-08-29 DIAGNOSIS — R112 Nausea with vomiting, unspecified: Secondary | ICD-10-CM | POA: Insufficient documentation

## 2012-08-29 DIAGNOSIS — R109 Unspecified abdominal pain: Secondary | ICD-10-CM | POA: Insufficient documentation

## 2012-08-29 DIAGNOSIS — Z79899 Other long term (current) drug therapy: Secondary | ICD-10-CM | POA: Insufficient documentation

## 2012-08-29 DIAGNOSIS — E785 Hyperlipidemia, unspecified: Secondary | ICD-10-CM | POA: Insufficient documentation

## 2012-08-29 DIAGNOSIS — Z90710 Acquired absence of both cervix and uterus: Secondary | ICD-10-CM | POA: Insufficient documentation

## 2012-08-29 DIAGNOSIS — F341 Dysthymic disorder: Secondary | ICD-10-CM | POA: Insufficient documentation

## 2012-08-29 DIAGNOSIS — R0989 Other specified symptoms and signs involving the circulatory and respiratory systems: Secondary | ICD-10-CM | POA: Insufficient documentation

## 2012-08-29 DIAGNOSIS — R0602 Shortness of breath: Secondary | ICD-10-CM | POA: Insufficient documentation

## 2012-08-29 DIAGNOSIS — Z8679 Personal history of other diseases of the circulatory system: Secondary | ICD-10-CM | POA: Insufficient documentation

## 2012-08-29 DIAGNOSIS — Z8719 Personal history of other diseases of the digestive system: Secondary | ICD-10-CM | POA: Insufficient documentation

## 2012-08-29 LAB — CBC WITH DIFFERENTIAL/PLATELET
Basophils Relative: 0 % (ref 0–1)
Eosinophils Relative: 0 % (ref 0–5)
HCT: 36.9 % (ref 36.0–46.0)
Hemoglobin: 12.6 g/dL (ref 12.0–15.0)
Lymphocytes Relative: 18 % (ref 12–46)
Monocytes Relative: 9 % (ref 3–12)
Neutro Abs: 7.6 10*3/uL (ref 1.7–7.7)
Neutrophils Relative %: 73 % (ref 43–77)
RBC: 4.07 MIL/uL (ref 3.87–5.11)

## 2012-08-29 LAB — COMPREHENSIVE METABOLIC PANEL
ALT: 10 U/L (ref 0–35)
AST: 22 U/L (ref 0–37)
Alkaline Phosphatase: 71 U/L (ref 39–117)
CO2: 22 mEq/L (ref 19–32)
Calcium: 9.5 mg/dL (ref 8.4–10.5)
GFR calc Af Amer: 42 mL/min — ABNORMAL LOW (ref >90)
GFR calc non Af Amer: 36 mL/min — ABNORMAL LOW (ref >90)
Glucose, Bld: 94 mg/dL (ref 70–99)
Potassium: 3.4 mEq/L — ABNORMAL LOW (ref 3.5–5.1)
Sodium: 136 mEq/L (ref 135–145)
Total Protein: 7.3 g/dL (ref 6.0–8.3)

## 2012-08-29 LAB — URINALYSIS, ROUTINE W REFLEX MICROSCOPIC
Glucose, UA: NEGATIVE mg/dL
Specific Gravity, Urine: 1.015 (ref 1.005–1.030)

## 2012-08-29 LAB — URINE MICROSCOPIC-ADD ON

## 2012-08-29 LAB — LIPASE, BLOOD: Lipase: 93 U/L — ABNORMAL HIGH (ref 11–59)

## 2012-08-29 LAB — PRO B NATRIURETIC PEPTIDE: Pro B Natriuretic peptide (BNP): 377.3 pg/mL (ref 0–450)

## 2012-08-29 MED ORDER — IOHEXOL 300 MG/ML  SOLN: 50.0000 mL | Freq: Once | INTRAMUSCULAR | Status: AC | PRN

## 2012-08-29 MED ORDER — SODIUM CHLORIDE 0.9 % IV BOLUS (SEPSIS)
250.0000 mL | Freq: Once | INTRAVENOUS | Status: AC
  Administered 2012-08-29: 250 mL via INTRAVENOUS

## 2012-08-29 MED ORDER — ONDANSETRON 8 MG PO TBDP
8.0000 mg | ORAL_TABLET | Freq: Once | ORAL | Status: AC
  Administered 2012-08-29: 8 mg via ORAL
  Filled 2012-08-29: qty 1

## 2012-08-29 MED ORDER — RANITIDINE HCL 150 MG PO TABS: 150.0000 mg | ORAL_TABLET | Freq: Two times a day (BID) | ORAL | Status: DC

## 2012-08-29 MED ORDER — ONDANSETRON HCL 4 MG/2ML IJ SOLN: 4.0000 mg | Freq: Once | INTRAMUSCULAR | Status: DC

## 2012-08-29 MED ORDER — ONDANSETRON HCL 4 MG PO TABS: 4.0000 mg | ORAL_TABLET | Freq: Four times a day (QID) | ORAL | Status: DC

## 2012-08-29 MED ORDER — IOHEXOL 300 MG/ML  SOLN
100.0000 mL | Freq: Once | INTRAMUSCULAR | Status: AC | PRN
  Administered 2012-08-29: 80 mL via INTRAVENOUS

## 2012-08-29 NOTE — ED Notes (Signed)
Pt ambulated to BR at this time.

## 2012-08-29 NOTE — ED Notes (Signed)
Per patient/grand daughter, unable to eat, keep anything down, b/l lower extremity edema-generalized weakness

## 2012-08-29 NOTE — ED Provider Notes (Signed)
History     CSN: [id number]  Arrival date & time 08/29/12  1441   First MD Initiated Contact with Patient 08/29/12 1521      Chief Complaint  Patient presents with  . Weakness    (Consider location/radiation/quality/duration/timing/severity/associated sxs/prior treatment) HPI Comments: Patient is brought to the emergency department by granddaughter for evaluation. Patient has reportedly been experiencing generalized weakness for a couple of weeks. Over this period of time she has had progressive swelling of her legs and decreased exercise tolerance. She gets winded walking across the room. She is not experiencing chest pain. Patient has had nausea and vomiting, cannot eat or drink. Family reports that she hasn't been able to hold anything down. She says she hasn't really in a week. She has had diarrhea in addition to the vomiting. Patient is complaining of pain all over. She cannot identify the area, doesn't think it is joins as much as all of her muscles hurting.  Patient is a 79 y.o. female presenting with weakness.  Weakness Associated symptoms include shortness of breath. Pertinent negatives include no chest pain.    Past Medical History  Diagnosis Date  . Breast cancer     hx of  . Depression with anxiety   . Diverticulosis     hx of  . GERD (gastroesophageal reflux disease)   . Hyperlipidemia   . Hypertension   . CKD (chronic kidney disease)   . H/O: hysterectomy   . Bradycardia   . Fatigue     History reviewed. No pertinent past surgical history.  Family History  Problem Relation Age of Onset  . Coronary artery disease Mother     family hx of  . Hypertension Mother     hx of    History  Substance Use Topics  . Smoking status: Former Games developer  . Smokeless tobacco: Not on file  . Alcohol Use: No    OB History   Grav Para Term Preterm Abortions TAB SAB Ect Mult Living                  Review of Systems  Constitutional: Positive for fatigue. Negative for  fever.  Respiratory: Positive for shortness of breath.   Cardiovascular: Positive for leg swelling. Negative for chest pain.  Gastrointestinal: Positive for nausea, vomiting and diarrhea.  Neurological: Positive for weakness.  All other systems reviewed and are negative.    Allergies  Review of patient's allergies indicates no known allergies.  Home Medications   Current Outpatient Rx  Name  Route  Sig  Dispense  Refill  . chlorthalidone (HYGROTON) 25 MG tablet      Take 1 po qd for htn   90 tablet   3   . ciprofloxacin (CIPRO) 250 MG tablet   Oral   Take 250 mg by mouth 2 (two) times daily.         . CRESTOR 20 MG tablet      TAKE ONE TABLET BY MOUTH ONE TIME DAILY   30 tablet   0   . gabapentin (NEURONTIN) 600 MG tablet   Oral   Take 600 mg by mouth 4 (four) times daily.           . hydrochlorothiazide (HYDRODIURIL) 25 MG tablet      TAKE ONE TABLET BY MOUTH ONE TIME DAILY   30 tablet   0   . LORazepam (ATIVAN) 0.5 MG tablet   Oral   Take 1 tablet (0.5 mg total) by mouth  2 (two) times daily as needed for anxiety.   60 tablet   1   . omeprazole (PRILOSEC OTC) 20 MG tablet   Oral   Take 1 tablet (20 mg total) by mouth daily.   60 tablet   3     BP 149/61  Pulse 104  Temp(Src) 98.7 F (37.1 C)  Resp 16  SpO2 94%  Physical Exam  Constitutional: She is oriented to person, place, and time. She appears well-developed and well-nourished. No distress.  HENT:  Head: Normocephalic and atraumatic.  Right Ear: Hearing normal.  Nose: Nose normal.  Mouth/Throat: Oropharynx is clear and moist and mucous membranes are normal.  Eyes: Conjunctivae and EOM are normal. Pupils are equal, round, and reactive to light.  Neck: Normal range of motion. Neck supple.  Cardiovascular: Normal rate, regular rhythm, S1 normal and S2 normal.  Exam reveals no gallop and no friction rub.   No murmur heard. Pulmonary/Chest: Effort normal. No respiratory distress. She has  rales in the right lower field and the left lower field. She exhibits no tenderness.  Abdominal: Soft. Normal appearance and bowel sounds are normal. There is no hepatosplenomegaly. There is no tenderness. There is no rebound, no guarding, no tenderness at McBurney's point and negative Murphy's sign. No hernia.  Musculoskeletal: Normal range of motion.  Neurological: She is alert and oriented to person, place, and time. She has normal strength. No cranial nerve deficit or sensory deficit. Coordination normal. GCS eye subscore is 4. GCS verbal subscore is 5. GCS motor subscore is 6.  Skin: Skin is warm, dry and intact. No rash noted. No cyanosis.  Psychiatric: She has a normal mood and affect. Her speech is normal and behavior is normal. Thought content normal.    ED Course  Procedures (including critical care time)   Date: 08/29/2012  Rate: 95  Rhythm: normal sinus rhythm  QRS Axis: left  Intervals: normal  ST/T Wave abnormalities: nonspecific ST/T changes  Conduction Disutrbances:left anterior fascicular block  Narrative Interpretation:   Old EKG Reviewed: none available    Labs Reviewed  COMPREHENSIVE METABOLIC PANEL - Abnormal; Notable for the following:    Potassium 3.4 (*)    Chloride 90 (*)    Creatinine, Ser 1.29 (*)    Albumin 2.8 (*)    Total Bilirubin 0.2 (*)    GFR calc non Af Amer 36 (*)    GFR calc Af Amer 42 (*)    All other components within normal limits  URINALYSIS, ROUTINE W REFLEX MICROSCOPIC - Abnormal; Notable for the following:    APPearance CLOUDY (*)    Hgb urine dipstick TRACE (*)    Bilirubin Urine SMALL (*)    Ketones, ur 40 (*)    Protein, ur 100 (*)    All other components within normal limits  LIPASE, BLOOD - Abnormal; Notable for the following:    Lipase 93 (*)    All other components within normal limits  URINE MICROSCOPIC-ADD ON - Abnormal; Notable for the following:    Squamous Epithelial / LPF FEW (*)    Casts GRANULAR CAST (*)    All  other components within normal limits  TROPONIN I  PRO B NATRIURETIC PEPTIDE  CBC WITH DIFFERENTIAL  CBC WITH DIFFERENTIAL   Ct Abdomen Pelvis W Contrast  08/29/2012  *RADIOLOGY REPORT*  Clinical Data: Lower abdominal pain  CT ABDOMEN AND PELVIS WITH CONTRAST  Technique:  Multidetector CT imaging of the abdomen and pelvis was performed following the  standard protocol during bolus administration of intravenous contrast.  Contrast: 80mL OMNIPAQUE IOHEXOL 300 MG/ML  SOLN, 1 OMNIPAQUE IOHEXOL 300 MG/ML  SOLN, 1 OMNIPAQUE IOHEXOL 300 MG/ML  SOLN  Comparison:   None  Findings: Bibasilar opacities, favor scarring or atelectasis. Right lower lobe nodular density measuring 3 mm.  Right mastectomy. Heart size upper normal to mildly enlarged.  Coronary artery and aortic valve calcification.  Small to moderate hiatal hernia.  Hepatic cyst adjacent to the gallbladder fossa.  Thin-walled gallbladder.  No biliary ductal dilatation.  Unremarkable spleen. Minimal fullness of the main pancreatic duct up to 3 mm, nonspecific.  No obstructing lesion is visualized.  Unremarkable adrenal glands.  Bilateral renal cysts, the larger of which measure water attenuation. Some contain thin internal septations.  There is nonspecific peripheral calcification along the inferior margin of a dominant cyst on the right.  No hydronephrosis or hydroureter.  Colonic diverticulosis.  No CT evidence for colitis or diverticulitis.  Appendix not identified.  No right lower quadrant inflammation.  No free intraperitoneal air or fluid.  There are a couple subcentimeter ileocolic lymph nodes, nonspecific.  There is scattered atherosclerotic calcification of the aorta and its branches. No aneurysmal dilatation.  Thin-walled bladder.  Absent uterus.  No adnexal mass.  Osteopenia and multilevel degenerative change.  No acute osseous finding.  IMPRESSION: No acute abdominopelvic process identified by CT.  Small to moderate hiatal hernia.  Bilateral renal  cysts of varying complexity.  3 mm right lower lobe nodule is nonspecific. Recommend comparison with outside imaging (none available in our system) and attention on follow-up.   Original Report Authenticated By: Jearld Lesch, M.D.      Diagnosis: 1. Generalized weakness 2. Abdominal pain    MDM  Patient presents to the ER for evaluation of generalized weakness. Patient will and her daughter reports that she has had abdominal pain with nausea and difficulty eating for a couple of weeks. They have noticed increased finding of her legs. She has not had any chest pain. There is no fever. Blood work was essentially unremarkable. I do not see any evidence of congestive heart failure. She does have some edema of both lower extremities, peripheral in nature. She was recently taken off of her hydrochlorothiazide and started on Norvasc for her hypertension. She was told she can restart the hydrochlorothiazide, as she is still hypertensive here in the ER.  She is complaining of abdominal pain, CAT scan was performed. No acute abnormalities were seen. Patient and daughter were reassured. She'll be discharged to home.        Gilda Crease, MD 08/29/12 [phone]

## 2012-08-29 NOTE — ED Notes (Signed)
Pt states last week when she was at Urgent care for continued problems with UTI, she was given Cipro and taken off of her Hydrochlorothiazide and put on Amlodipine for hypertension.

## 2012-09-18 ENCOUNTER — Other Ambulatory Visit: Payer: Self-pay | Admitting: Radiology

## 2012-09-18 NOTE — Telephone Encounter (Signed)
Please advise on renewal of Zofran, pended

## 2012-09-21 ENCOUNTER — Ambulatory Visit (INDEPENDENT_AMBULATORY_CARE_PROVIDER_SITE_OTHER): Payer: Medicare Other | Admitting: Family Medicine

## 2012-09-21 ENCOUNTER — Ambulatory Visit: Payer: Medicare Other

## 2012-09-21 VITALS — BP 158/68 | HR 75 | Temp 97.7°F | Resp 18 | Ht 59.75 in | Wt 132.0 lb

## 2012-09-21 DIAGNOSIS — J154 Pneumonia due to other streptococci: Secondary | ICD-10-CM

## 2012-09-21 DIAGNOSIS — E7800 Pure hypercholesterolemia, unspecified: Secondary | ICD-10-CM

## 2012-09-21 DIAGNOSIS — M25473 Effusion, unspecified ankle: Secondary | ICD-10-CM

## 2012-09-21 DIAGNOSIS — B0229 Other postherpetic nervous system involvement: Secondary | ICD-10-CM

## 2012-09-21 DIAGNOSIS — I10 Essential (primary) hypertension: Secondary | ICD-10-CM

## 2012-09-21 MED ORDER — AMLODIPINE BESYLATE 5 MG PO TABS: 5.0000 mg | ORAL_TABLET | Freq: Every day | ORAL | Status: DC

## 2012-09-21 MED ORDER — GABAPENTIN 600 MG PO TABS: 600.0000 mg | ORAL_TABLET | Freq: Three times a day (TID) | ORAL | Status: DC

## 2012-09-21 MED ORDER — ROSUVASTATIN CALCIUM 20 MG PO TABS: 20.0000 mg | ORAL_TABLET | Freq: Every evening | ORAL | Status: DC

## 2012-09-21 NOTE — Progress Notes (Signed)
[facility] [address] [place] [address] [phone]  Date:  09/21/2012   Name:  Kylie Allen   DOB:  10-Jul-1923   MRN:  [id number]  PCP:  Sanda Linger, MD    Chief Complaint: Medication Refill and Abdominal Pain   History of Present Illness:  Kylie Allen is a 79 y.o. very pleasant female patient who presents with the following:  Seen here 08/17/12 for medication refill.  Then seen at the ER on 08/29/12 with weakness, swelling of her legs, no CP, and abdominal pain.  She was not noted to have any sign of CHF.  She had a negative CT abdomen and pelvis.  I do not see a CXR, but BNP was 377 which is within normal range  She is here today for a recheck.  Since she left the hospital she is able to eat again.   She still notes some swelling in both ankles.   When asked why she is here today she states that "I need medicine," states she is out of refills.   Also states that she needs "pain medicine" for post- herpetic neuralgia left over from shingles 10 years ago.  Her controlled substance database report does not reveal any narcotics- just her ativan, she does not need any RF of this. She does have neurontin on her medication list, but she has not had this filled recently per pharmacist report.  She states she "takes as I need" and she may take up to 4 a day.  She does have a bottle of this with her and states she is still actively using this med for post- shingles pain in her left thoracic area  Patient Active Problem List  Diagnosis  . HYPERLIPIDEMIA  . DEPRESSION  . MIGRAINE HEADACHE  . HYPERTENSION  . GERD  . CHRONIC KIDNEY DISEASE UNSPECIFIED  . BREAST CANCER, HX OF  . SYNCOPE, HX OF  . DIVERTICULITIS, HX OF  . UTI'S, HX OF  . CHICKENPOX, HX OF  . Bradycardia    Past Medical History  Diagnosis Date  . Breast cancer     hx of  . Depression with anxiety   . Diverticulosis     hx of  . GERD (gastroesophageal reflux disease)   .  Hyperlipidemia   . Hypertension   . CKD (chronic kidney disease)   . H/O: hysterectomy   . Bradycardia   . Fatigue     No past surgical history on file.  History  Substance Use Topics  . Smoking status: Former Games developer  . Smokeless tobacco: Not on file  . Alcohol Use: No    Family History  Problem Relation Age of Onset  . Coronary artery disease Mother     family hx of  . Hypertension Mother     hx of    No Known Allergies  Medication list has been reviewed and updated.  Current Outpatient Prescriptions on File Prior to Visit  Medication Sig Dispense Refill  . aspirin 325 MG tablet Take 325 mg by mouth every 6 (six) hours as needed for pain.      Marland Kitchen gabapentin (NEURONTIN) 600 MG tablet Take 600 mg by mouth 4 (four) times daily.        Marland Kitchen LORazepam (ATIVAN) 0.5 MG tablet Take 1 tablet (0.5 mg total) by mouth 2 (two) times daily as needed for anxiety.  60 tablet  1  . rosuvastatin (CRESTOR) 20 MG tablet Take 20 mg by mouth every  evening.      Marland Kitchen amLODipine (NORVASC) 5 MG tablet Take 5 mg by mouth daily.      . ondansetron (ZOFRAN) 4 MG tablet Take 1 tablet (4 mg total) by mouth every 6 (six) hours.  12 tablet  0  . ranitidine (ZANTAC) 150 MG tablet Take 1 tablet (150 mg total) by mouth 2 (two) times daily.  60 tablet  0   No current facility-administered medications on file prior to visit.    Review of Systems:  As per HPI- otherwise negative.   Physical Examination: Filed Vitals:   09/21/12 1358  BP: 158/68  Pulse: 75  Temp: 97.7 F (36.5 C)  Resp: 18   Filed Vitals:   09/21/12 1358  Height: 4' 11.75" (1.518 m)  Weight: 132 lb (59.875 kg)   Body mass index is 25.98 kg/(m^2). Ideal Body Weight: Weight in (lb) to have BMI = 25: 126.7  GEN: WDWN, NAD, Non-toxic, A & O x 3 HEENT: Atraumatic, Normocephalic. Neck supple. No masses, No LAD.  Bilateral TM wnl, oropharynx normal.  PEERL,EOMI.   Ears and Nose: No external deformity. CV: RRR, No M/G/R. No JVD. No  thrill. No extra heart sounds. PULM: CTA B, no wheezes, crackles, rhonchi. No retractions. No resp. distress. No accessory muscle use. ABD: S, NT, ND. No rebound. No HSM. EXTR: No c/c/e NEURO Normal gait.  PSYCH: Normally interactive. Conversant. Not depressed or anxious appearing.  Calm demeanor.   [facility] reading (PRIMARY) by  Dr. Patsy Lager. CXR: no active or acute disease, normal heart size  CHEST - 2 VIEW  Comparison: None  Findings: The heart size and mediastinal contours are within normal limits. Both lungs are clear. The visualized skeletal structures are unremarkable.  IMPRESSION: No active disease.  Assessment and Plan Ankle edema - Plan: DG Chest 2 View  HTN (hypertension) - Plan: amLODipine (NORVASC) 5 MG tablet  High cholesterol - Plan: rosuvastatin (CRESTOR) 20 MG tablet  Post herpetic neuralgia - Plan: gabapentin (NEURONTIN) 600 MG tablet  CXR is normal today.  Refilled her HTN medication and crestor.  Refilled her neurontin but recommended that she not take this more than 3x a day.  She will follow- up as needed.    Meds ordered this encounter  Medications  . rosuvastatin (CRESTOR) 20 MG tablet    Sig: Take 1 tablet (20 mg total) by mouth every evening.    Dispense:  30 tablet    Refill:  6  . amLODipine (NORVASC) 5 MG tablet    Sig: Take 1 tablet (5 mg total) by mouth daily.    Dispense:  30 tablet    Refill:  6  . gabapentin (NEURONTIN) 600 MG tablet    Sig: Take 1 tablet (600 mg total) by mouth 3 (three) times daily. As needed for pain    Dispense:  90 tablet    Refill:  3     Signed Abbe Amsterdam, MD

## 2012-09-29 ENCOUNTER — Other Ambulatory Visit: Payer: Self-pay | Admitting: Physician Assistant

## 2012-10-10 ENCOUNTER — Other Ambulatory Visit: Payer: Self-pay | Admitting: Internal Medicine

## 2012-10-11 ENCOUNTER — Telehealth: Payer: Self-pay

## 2012-10-11 DIAGNOSIS — E785 Hyperlipidemia, unspecified: Secondary | ICD-10-CM

## 2012-10-11 DIAGNOSIS — F411 Generalized anxiety disorder: Secondary | ICD-10-CM

## 2012-10-11 DIAGNOSIS — Z79899 Other long term (current) drug therapy: Secondary | ICD-10-CM

## 2012-10-11 DIAGNOSIS — I10 Essential (primary) hypertension: Secondary | ICD-10-CM

## 2012-10-11 DIAGNOSIS — B0229 Other postherpetic nervous system involvement: Secondary | ICD-10-CM

## 2012-10-11 NOTE — Telephone Encounter (Signed)
Pharmacy requests RF of lorazepam 0.5 mg, 1 tablet BID prn for anxiety.

## 2012-10-17 NOTE — Telephone Encounter (Signed)
rtc 

## 2012-10-21 ENCOUNTER — Other Ambulatory Visit: Payer: Self-pay | Admitting: Family Medicine

## 2012-10-22 ENCOUNTER — Telehealth: Payer: Self-pay

## 2012-10-22 DIAGNOSIS — F411 Generalized anxiety disorder: Secondary | ICD-10-CM

## 2012-10-22 DIAGNOSIS — Z79899 Other long term (current) drug therapy: Secondary | ICD-10-CM

## 2012-10-22 DIAGNOSIS — E785 Hyperlipidemia, unspecified: Secondary | ICD-10-CM

## 2012-10-22 DIAGNOSIS — I10 Essential (primary) hypertension: Secondary | ICD-10-CM

## 2012-10-22 DIAGNOSIS — B0229 Other postherpetic nervous system involvement: Secondary | ICD-10-CM

## 2012-10-22 NOTE — Telephone Encounter (Signed)
Sent message to Dr Patsy Lager to advise.

## 2012-10-22 NOTE — Telephone Encounter (Signed)
Paper chart pulled and in Dr. Cyndie Chime box

## 2012-10-22 NOTE — Telephone Encounter (Signed)
Patient would like a refill of lorazepam. She doesn't know why Dr Kylie Allen didn't refill it when she requested it last (he asked her to RTC). She says she's only seen him once and normally sees Dr Kylie Allen and she has been taking it for years.

## 2012-10-22 NOTE — Telephone Encounter (Signed)
Can I have her paper chart please?

## 2012-10-23 MED ORDER — LORAZEPAM 0.5 MG PO TABS: 0.5000 mg | ORAL_TABLET | Freq: Two times a day (BID) | ORAL | Status: DC | PRN

## 2012-10-23 NOTE — Telephone Encounter (Signed)
Reviewed her chart - she has been on lorazepam for some time.  Will refill for her today- she takes 0.5 BID as needed. Will refill for her today, but will need to recheck to discuss in the next few months

## 2012-11-26 ENCOUNTER — Other Ambulatory Visit: Payer: Self-pay | Admitting: Physician Assistant

## 2012-11-26 NOTE — Telephone Encounter (Signed)
Dr Patsy Lager, do you want to RF this for pt, or even want her on it? I don't see it on her med list from last OV.

## 2012-11-27 NOTE — Telephone Encounter (Signed)
Tried to call her but number does not work.  Will call her pharmacy- I am not sure if she is still taking this medication.   Pharmacist confirmed that she is taking this medication- will refill for her

## 2012-12-27 ENCOUNTER — Other Ambulatory Visit: Payer: Self-pay | Admitting: Family Medicine

## 2012-12-27 DIAGNOSIS — F411 Generalized anxiety disorder: Secondary | ICD-10-CM

## 2013-01-30 ENCOUNTER — Other Ambulatory Visit: Payer: Self-pay | Admitting: Family Medicine

## 2013-01-31 ENCOUNTER — Ambulatory Visit (INDEPENDENT_AMBULATORY_CARE_PROVIDER_SITE_OTHER): Payer: Medicare Other | Admitting: Family Medicine

## 2013-01-31 VITALS — BP 114/74 | HR 74 | Temp 98.0°F | Resp 17 | Ht 60.0 in | Wt 130.0 lb

## 2013-01-31 DIAGNOSIS — I10 Essential (primary) hypertension: Secondary | ICD-10-CM

## 2013-01-31 DIAGNOSIS — B0229 Other postherpetic nervous system involvement: Secondary | ICD-10-CM

## 2013-01-31 DIAGNOSIS — F411 Generalized anxiety disorder: Secondary | ICD-10-CM

## 2013-01-31 MED ORDER — LORAZEPAM 0.5 MG PO TABS: ORAL_TABLET | ORAL | Status: DC

## 2013-01-31 MED ORDER — GABAPENTIN 600 MG PO TABS: 600.0000 mg | ORAL_TABLET | Freq: Three times a day (TID) | ORAL | Status: DC

## 2013-01-31 MED ORDER — HYDROCHLOROTHIAZIDE 25 MG PO TABS: ORAL_TABLET | ORAL | Status: DC

## 2013-01-31 NOTE — Patient Instructions (Addendum)
Continue to take your medications as directed.  I do recommend that you work on cutting down on your use of lorazepam (ativan).   I will be in touch with your labs.   Let's plan to recheck in 3 to 4 months.

## 2013-01-31 NOTE — Telephone Encounter (Signed)
Patient here now!

## 2013-01-31 NOTE — Progress Notes (Addendum)
[facility] [address], [place] [address] [phone]  Date:  01/31/2013   Name:  Kylie Allen   DOB:  [birth year]   MRN:  [id number]  PCP:  Sanda Linger, MD    Chief Complaint: Medication Refill   History of Present Illness:  Kylie Allen is a 79 y.o. very pleasant female patient who presents with the following:  She is here today for a medication refill.  She is on chronic lorazepam- has used it for many years.  I have tried to taper her dosage but so far have not been successful.  Tried to have her decrease to 1/2 of her 0.5 mg ativan twice a day prn, but she states that she cannot function with this dose due to "my nerves."    Patient Active Problem List   Diagnosis Date Noted  . Bradycardia 04/19/2011  . HYPERLIPIDEMIA 05/10/2009  . DEPRESSION 05/10/2009  . MIGRAINE HEADACHE 05/10/2009  . HYPERTENSION 05/10/2009  . GERD 05/10/2009  . CHRONIC KIDNEY DISEASE UNSPECIFIED 05/10/2009  . BREAST CANCER, HX OF 05/10/2009  . SYNCOPE, HX OF 05/10/2009  . DIVERTICULITIS, HX OF 05/10/2009  . UTI'S, HX OF 05/10/2009  . CHICKENPOX, HX OF 05/10/2009    Past Medical History  Diagnosis Date  . Breast cancer     hx of  . Depression with anxiety   . Diverticulosis     hx of  . GERD (gastroesophageal reflux disease)   . Hyperlipidemia   . Hypertension   . CKD (chronic kidney disease)   . H/O: hysterectomy   . Bradycardia   . Fatigue     No past surgical history on file.  History  Substance Use Topics  . Smoking status: Former Games developer  . Smokeless tobacco: Not on file  . Alcohol Use: No    Family History  Problem Relation Age of Onset  . Coronary artery disease Mother     family hx of  . Hypertension Mother     hx of    No Known Allergies  Medication list has been reviewed and updated.  Current Outpatient Prescriptions on File Prior to Visit  Medication Sig Dispense Refill  . amLODipine (NORVASC) 5 MG tablet Take 1 tablet (5 mg  total) by mouth daily.  30 tablet  6  . aspirin 325 MG tablet Take 325 mg by mouth every 6 (six) hours as needed for pain.      Marland Kitchen gabapentin (NEURONTIN) 600 MG tablet Take 1 tablet (600 mg total) by mouth 3 (three) times daily. As needed for pain  90 tablet  3  . hydrochlorothiazide (HYDRODIURIL) 25 MG tablet TAKE 1 TABLET BY MOUTH DAILY  30 tablet  1  . LORazepam (ATIVAN) 0.5 MG tablet Take 1/2 tablet BID as needed for anxiety  60 tablet  0  . ondansetron (ZOFRAN) 4 MG tablet Take 1 tablet (4 mg total) by mouth every 6 (six) hours.  12 tablet  0  . ranitidine (ZANTAC) 150 MG tablet Take 1 tablet (150 mg total) by mouth 2 (two) times daily.  60 tablet  0  . rosuvastatin (CRESTOR) 20 MG tablet Take 1 tablet (20 mg total) by mouth every evening.  30 tablet  6   No current facility-administered medications on file prior to visit.    Review of Systems:  As per HPI- otherwise negative.   Physical Examination: Filed Vitals:   01/31/13 1153  BP: 114/74  Pulse: 74  Temp: 98 F (36.7  C)  Resp: 17   Filed Vitals:   01/31/13 1153  Height: 5' (1.524 m)  Weight: 130 lb (58.968 kg)   Body mass index is 25.39 kg/(m^2). Ideal Body Weight: Weight in (lb) to have BMI = 25: 127.7  GEN: WDWN, NAD, Non-toxic, A & O x 3, appears stated age HEENT: Atraumatic, Normocephalic. Neck supple. No masses, No LAD. Ears and Nose: No external deformity. CV: RRR, No M/G/R. No JVD. No thrill. No extra heart sounds. PULM: CTA B, no wheezes, crackles, rhonchi. No retractions. No resp. distress. No accessory muscle use. EXTR: No c/c/e NEURO Normal gait.  PSYCH: Normally interactive. Conversant. Not depressed or anxious appearing.  Calm demeanor.    Assessment and Plan: Post herpetic neuralgia - Plan: gabapentin (NEURONTIN) 600 MG tablet  Anxiety state, unspecified - Plan: LORazepam (ATIVAN) 0.5 MG tablet  Essential hypertension, benign - Plan: hydrochlorothiazide (HYDRODIURIL) 25 MG tablet, Basic metabolic  panel  Chronic post- shingles pain.  Refilled her gabapentin Anxiety: refilled her lorazepam.  She may take up to 0.5 mg BID.  Have tried to counsel her to decrease her dose but she feels unable.   Refilled her HCTZ, check BMP Discussed lowering her anti- hypertensive medication dose but she is afraid her BP will get too high again.   Signed Abbe Amsterdam, MD  8/16 Addnd: received her BMP.  Called listed home number- someone answered and let me know number is wrong.  Called emergency contact number for her daughter and [facility]- please call or have her mom call us with her correct phone number.    Results for orders placed in visit on 01/31/13  BASIC METABOLIC PANEL      Result Value Range   Sodium 139  135 - 145 mEq/L   Potassium 3.3 (*) 3.5 - 5.3 mEq/L   Chloride 99  96 - 112 mEq/L   CO2 28  19 - 32 mEq/L   Glucose, Bld 119 (*) 70 - 99 mg/dL   BUN 21  6 - 23 mg/dL   Creat 1.61 (*) 0.96 - 1.10 mg/dL   Calcium 9.9  8.4 - 04.5 mg/dL

## 2013-02-01 ENCOUNTER — Encounter: Payer: Self-pay | Admitting: Family Medicine

## 2013-02-01 LAB — BASIC METABOLIC PANEL
Potassium: 3.3 mEq/L — ABNORMAL LOW (ref 3.5–5.3)
Sodium: 139 mEq/L (ref 135–145)

## 2013-05-08 ENCOUNTER — Other Ambulatory Visit: Payer: Self-pay | Admitting: Family Medicine

## 2013-05-08 DIAGNOSIS — F411 Generalized anxiety disorder: Secondary | ICD-10-CM

## 2013-05-19 ENCOUNTER — Ambulatory Visit (INDEPENDENT_AMBULATORY_CARE_PROVIDER_SITE_OTHER): Payer: Medicare Other | Admitting: Family Medicine

## 2013-05-19 VITALS — BP 120/80 | HR 72 | Temp 98.1°F | Resp 18 | Ht 60.5 in | Wt 131.0 lb

## 2013-05-19 DIAGNOSIS — I10 Essential (primary) hypertension: Secondary | ICD-10-CM

## 2013-05-19 DIAGNOSIS — K219 Gastro-esophageal reflux disease without esophagitis: Secondary | ICD-10-CM

## 2013-05-19 DIAGNOSIS — F411 Generalized anxiety disorder: Secondary | ICD-10-CM

## 2013-05-19 LAB — BASIC METABOLIC PANEL
BUN: 25 mg/dL — ABNORMAL HIGH (ref 6–23)
CO2: 30 mEq/L (ref 19–32)
Calcium: 10.2 mg/dL (ref 8.4–10.5)
Creat: 1.4 mg/dL — ABNORMAL HIGH (ref 0.50–1.10)

## 2013-05-19 LAB — POCT CBC
Hemoglobin: 13 g/dL (ref 12.2–16.2)
Lymph, poc: 2.1 (ref 0.6–3.4)
MCH, POC: 30.1 pg (ref 27–31.2)
MCHC: 30.1 g/dL — AB (ref 31.8–35.4)
MID (cbc): 0.4 (ref 0–0.9)
MPV: 9.9 fL (ref 0–99.8)
POC MID %: 4.9 %M (ref 0–12)
WBC: 8.2 10*3/uL (ref 4.6–10.2)

## 2013-05-19 MED ORDER — RANITIDINE HCL 150 MG PO TABS: ORAL_TABLET | ORAL | Status: DC

## 2013-05-19 MED ORDER — LORAZEPAM 0.5 MG PO TABS: 0.5000 mg | ORAL_TABLET | Freq: Two times a day (BID) | ORAL | Status: DC | PRN

## 2013-05-19 NOTE — Patient Instructions (Signed)
I will be in touch with your labs.  Remember that I do encourage you to try and cut down on your lorazepam use- maybe try a 1/2 pill instead of a whole pill some of the time.  Take care and happy holidays!

## 2013-05-19 NOTE — Progress Notes (Signed)
[facility] [address], [place] [address] [phone]  Date:  05/19/2013   Name:  Kylie Allen   DOB:  10-22-1923   MRN:  [id number]  PCP:  Sanda Linger, MD    Chief Complaint: Medication Refill   History of Present Illness:  Kylie Allen is a 79 y.o. very pleasant female patient who presents with the following:  She is here today for a medication review.  She needs more of her lorazepam.  She also needs a refill of her zantac. She uses this as needed.    Nephrologist is Dr. Hyman Hopes.    Patient Active Problem List   Diagnosis Date Noted  . Bradycardia 04/19/2011  . HYPERLIPIDEMIA 05/10/2009  . DEPRESSION 05/10/2009  . MIGRAINE HEADACHE 05/10/2009  . HYPERTENSION 05/10/2009  . GERD 05/10/2009  . CHRONIC KIDNEY DISEASE UNSPECIFIED 05/10/2009  . BREAST CANCER, HX OF 05/10/2009  . SYNCOPE, HX OF 05/10/2009  . DIVERTICULITIS, HX OF 05/10/2009  . UTI'S, HX OF 05/10/2009  . CHICKENPOX, HX OF 05/10/2009    Past Medical History  Diagnosis Date  . Breast cancer     hx of  . Depression with anxiety   . Diverticulosis     hx of  . GERD (gastroesophageal reflux disease)   . Hyperlipidemia   . Hypertension   . CKD (chronic kidney disease)   . H/O: hysterectomy   . Bradycardia   . Fatigue     History reviewed. No pertinent past surgical history.  History  Substance Use Topics  . Smoking status: Former Games developer  . Smokeless tobacco: Not on file  . Alcohol Use: No    Family History  Problem Relation Age of Onset  . Coronary artery disease Mother     family hx of  . Hypertension Mother     hx of    No Known Allergies  Medication list has been reviewed and updated.  Current Outpatient Prescriptions on File Prior to Visit  Medication Sig Dispense Refill  . amLODipine (NORVASC) 5 MG tablet Take 1 tablet (5 mg total) by mouth daily.  30 tablet  6  . aspirin 325 MG tablet Take 325 mg by mouth every 6 (six) hours as needed for pain.       Marland Kitchen gabapentin (NEURONTIN) 600 MG tablet Take 1 tablet (600 mg total) by mouth 3 (three) times daily. As needed for pain  90 tablet  6  . hydrochlorothiazide (HYDRODIURIL) 25 MG tablet TAKE 1 TABLET BY MOUTH DAILY  30 tablet  6  . LORazepam (ATIVAN) 0.5 MG tablet Take 1 tablet (0.5 mg total) by mouth 2 (two) times daily as needed for anxiety. Take 1 tablet twice a day as needed for anxiety.  We need to see you at clinic for follow-up soon.  15 tablet  0  . rosuvastatin (CRESTOR) 20 MG tablet Take 1 tablet (20 mg total) by mouth every evening.  30 tablet  6  . ondansetron (ZOFRAN) 4 MG tablet Take 1 tablet (4 mg total) by mouth every 6 (six) hours.  12 tablet  0  . ranitidine (ZANTAC) 150 MG tablet Take 1 tablet (150 mg total) by mouth 2 (two) times daily.  60 tablet  0   No current facility-administered medications on file prior to visit.    Review of Systems:  As per HPI- otherwise negative.   Physical Examination: Filed Vitals:   05/19/13 1308  BP: 120/80  Pulse: 72  Temp: 98.1 F (36.7  C)  Resp: 18   Filed Vitals:   05/19/13 1308  Height: 5' 0.5" (1.537 m)  Weight: 131 lb (59.421 kg)   Body mass index is 25.15 kg/(m^2). Ideal Body Weight: Weight in (lb) to have BMI = 25: 129.9  GEN: WDWN, NAD, Non-toxic, A & O x 3, looks well HEENT: Atraumatic, Normocephalic. Neck supple. No masses, No LAD. Ears and Nose: No external deformity. CV: RRR, No M/G/R. No JVD. No thrill. No extra heart sounds. PULM: CTA B, no wheezes, crackles, rhonchi. No retractions. No resp. distress. No accessory muscle use. EXTR: No c/c/e NEURO Normal gait.  PSYCH: Normally interactive. Conversant. Not depressed or anxious appearing.  Calm demeanor.    Assessment and Plan: Generalized anxiety disorder - Plan: LORazepam (ATIVAN) 0.5 MG tablet  GERD (gastroesophageal reflux disease) - Plan: ranitidine (ZANTAC) 150 MG tablet  HTN (hypertension) - Plan: POCT CBC, Basic metabolic panel  Refilled her  lorazepam for prn use.  Again encouraged her to decrease her use of this medication due to her age.  She continues to feel that she cannot stop this medication as she cannot sleep or otherwise do her daily activities without it.  She understands that I encourage her to taper her use with the goal of stopping Refilled her zantac as well Will plan further follow- up pending labs.   Signed Abbe Amsterdam, MD

## 2013-07-23 ENCOUNTER — Encounter (HOSPITAL_COMMUNITY): Payer: Self-pay | Admitting: Emergency Medicine

## 2013-07-23 ENCOUNTER — Emergency Department (HOSPITAL_COMMUNITY)
Admission: EM | Admit: 2013-07-23 | Discharge: 2013-07-23 | Disposition: A | Discharge status: Home / Self Care | Payer: PRIVATE HEALTH INSURANCE | Attending: Emergency Medicine | Admitting: Emergency Medicine

## 2013-07-23 DIAGNOSIS — N189 Chronic kidney disease, unspecified: Secondary | ICD-10-CM | POA: Insufficient documentation

## 2013-07-23 DIAGNOSIS — K047 Periapical abscess without sinus: Secondary | ICD-10-CM

## 2013-07-23 DIAGNOSIS — Z79899 Other long term (current) drug therapy: Secondary | ICD-10-CM | POA: Insufficient documentation

## 2013-07-23 DIAGNOSIS — K219 Gastro-esophageal reflux disease without esophagitis: Secondary | ICD-10-CM | POA: Insufficient documentation

## 2013-07-23 DIAGNOSIS — F341 Dysthymic disorder: Secondary | ICD-10-CM | POA: Insufficient documentation

## 2013-07-23 DIAGNOSIS — K029 Dental caries, unspecified: Secondary | ICD-10-CM | POA: Insufficient documentation

## 2013-07-23 DIAGNOSIS — I129 Hypertensive chronic kidney disease with stage 1 through stage 4 chronic kidney disease, or unspecified chronic kidney disease: Secondary | ICD-10-CM | POA: Insufficient documentation

## 2013-07-23 DIAGNOSIS — Z87891 Personal history of nicotine dependence: Secondary | ICD-10-CM | POA: Insufficient documentation

## 2013-07-23 DIAGNOSIS — Z853 Personal history of malignant neoplasm of breast: Secondary | ICD-10-CM | POA: Insufficient documentation

## 2013-07-23 DIAGNOSIS — Z90710 Acquired absence of both cervix and uterus: Secondary | ICD-10-CM | POA: Insufficient documentation

## 2013-07-23 DIAGNOSIS — R630 Anorexia: Secondary | ICD-10-CM | POA: Insufficient documentation

## 2013-07-23 DIAGNOSIS — E785 Hyperlipidemia, unspecified: Secondary | ICD-10-CM | POA: Insufficient documentation

## 2013-07-23 DIAGNOSIS — Z7982 Long term (current) use of aspirin: Secondary | ICD-10-CM | POA: Insufficient documentation

## 2013-07-23 MED ORDER — HYDROCODONE-ACETAMINOPHEN 5-325 MG PO TABS: 1.0000 | ORAL_TABLET | ORAL | Status: DC | PRN

## 2013-07-23 MED ORDER — CLINDAMYCIN HCL 150 MG PO CAPS: 150.0000 mg | ORAL_CAPSULE | Freq: Four times a day (QID) | ORAL | Status: DC

## 2013-07-23 NOTE — Discharge Instructions (Signed)
Dental Care and Dentist Visits Dental care supports good overall health. Regular dental visits can also help you avoid dental pain, bleeding, infection, and other more serious health problems in the future. It is important to keep the mouth healthy because diseases in the teeth, gums, and other oral tissues can spread to other areas of the body. Some problems, such as diabetes, heart disease, and pre-term labor have been associated with poor oral health.  See your dentist every 6 months. If you experience emergency problems such as a toothache or broken tooth, go to the dentist right away. If you see your dentist regularly, you may catch problems early. It is easier to be treated for problems in the early stages.  WHAT TO EXPECT AT A DENTIST VISIT  Your dentist will look for many common oral health problems and recommend proper treatment. At your regular dental visit, you can expect:  Gentle cleaning of the teeth and gums. This includes scraping and polishing. This helps to remove the sticky substance around the teeth and gums (plaque). Plaque forms in the mouth shortly after eating. Over time, plaque hardens on the teeth as tartar. If tartar is not removed regularly, it can cause problems. Cleaning also helps remove stains.  Periodic X-rays. These pictures of the teeth and supporting bone will help your dentist assess the health of your teeth.  Periodic fluoride treatments. Fluoride is a natural mineral shown to help strengthen teeth. Fluoride treatmentinvolves applying a fluoride gel or varnish to the teeth. It is most commonly done in children.  Examination of the mouth, tongue, jaws, teeth, and gums to look for any oral health problems, such as:  Cavities (dental caries). This is decay on the tooth caused by plaque, sugar, and acid in the mouth. It is best to catch a cavity when it is small.  Inflammation of the gums caused by plaque buildup (gingivitis).  Problems with the mouth or malformed  or misaligned teeth.  Oral cancer or other diseases of the soft tissues or jaws. KEEP YOUR TEETH AND GUMS HEALTHY For healthy teeth and gums, follow these general guidelines as well as your dentist's specific advice:  Have your teeth professionally cleaned at the dentist every 6 months.  Brush twice daily with a fluoride toothpaste.  Floss your teeth daily.  Ask your dentist if you need fluoride supplements, treatments, or fluoride toothpaste.  Eat a healthy diet. Reduce foods and drinks with added sugar.  Avoid smoking. TREATMENT FOR ORAL HEALTH PROBLEMS If you have oral health problems, treatment varies depending on the conditions present in your teeth and gums.  Your caregiver will most likely recommend good oral hygiene at each visit.  For cavities, gingivitis, or other oral health disease, your caregiver will perform a procedure to treat the problem. This is typically done at a separate appointment. Sometimes your caregiver will refer you to another dental specialist for specific tooth problems or for surgery. SEEK IMMEDIATE DENTAL CARE IF:  You have pain, bleeding, or soreness in the gum, tooth, jaw, or mouth area.  A permanent tooth becomes loose or separated from the gum socket.  You experience a blow or injury to the mouth or jaw area. Document Released: 02/15/2011 Document Revised: 08/28/2011 Document Reviewed: 02/15/2011 [facility] Patient Information 2014 [place], [place].  Dental Abscess A dental abscess is a collection of infected fluid (pus) from a bacterial infection in the inner part of the tooth (pulp). It usually occurs at the end of the tooth's root.  CAUSES  Severe tooth decay.  Trauma to the tooth that allows bacteria to enter into the pulp, such as a broken or chipped tooth. SYMPTOMS   Severe pain in and around the infected tooth.  Swelling and redness around the abscessed tooth or in the mouth or face.  Tenderness.  Pus drainage.  Bad  breath.  Bitter taste in the mouth.  Difficulty swallowing.  Difficulty opening the mouth.  Nausea.  Vomiting.  Chills.  Swollen neck glands. DIAGNOSIS   A medical and dental history will be taken.  An examination will be performed by tapping on the abscessed tooth.  X-rays may be taken of the tooth to identify the abscess. TREATMENT The goal of treatment is to eliminate the infection. You may be prescribed antibiotic medicine to stop the infection from spreading. A root canal may be performed to save the tooth. If the tooth cannot be saved, it may be pulled (extracted) and the abscess may be drained.  HOME CARE INSTRUCTIONS  Only take over-the-counter or prescription medicines for pain, fever, or discomfort as directed by your caregiver.  Rinse your mouth (gargle) often with salt water ( tsp salt in 8 oz [250 ml] of warm water) to relieve pain or swelling.  Do not drive after taking pain medicine (narcotics).  Do not apply heat to the outside of your face.  Return to your dentist for further treatment as directed. SEEK MEDICAL CARE IF:  Your pain is not helped by medicine.  Your pain is getting worse instead of better. SEEK IMMEDIATE MEDICAL CARE IF:  You have a fever or persistent symptoms for more than 2 3 days.  You have a fever and your symptoms suddenly get worse.  You have chills or a very bad headache.  You have problems breathing or swallowing.  You have trouble opening your mouth.  You have swelling in the neck or around the eye. Document Released: 06/05/2005 Document Revised: 02/28/2012 Document Reviewed: 09/13/2010 [facility] Patient Information 2014 [place], [place].

## 2013-07-23 NOTE — ED Provider Notes (Signed)
CSN: [id number]     Arrival date & time 07/23/13  1611 History  This chart was scribed for non-physician practitioner Wille Glaser, PA-C working with Merryl Hacker, MD by Anastasia Pall, ED scribe. This patient was seen in room [place] and the patient's care was started at 4:35 PM.   Chief Complaint  Patient presents with  . Dental Pain   (Consider location/radiation/quality/duration/timing/severity/associated sxs/prior Treatment) The history is provided by the patient. No language interpreter was used.   HPI Comments: Kylie Allen is a 80 y.o. female who presents to the Emergency Department complaining of gradually worsening, abscess over her lower, left tooth, with associated constant swelling and pain, onset 4 days ago. She states that eating, chewing exacerbates her pain, and having decreased appetite due to the pain. She reports h/o similar abscess. She reports associated night sweats. She denies trouble swallowing, fever, chills, and any other associated symptoms. She denies any known allergies to medication.   PCP - [name], MD  Past Medical History  Diagnosis Date  . Breast cancer     hx of  . Depression with anxiety   . Diverticulosis     hx of  . GERD (gastroesophageal reflux disease)   . Hyperlipidemia   . Hypertension   . CKD (chronic kidney disease)   . H/O: hysterectomy   . Bradycardia   . Fatigue    Past Surgical History  Procedure Laterality Date  . Mastectomy Right    Family History  Problem Relation Age of Onset  . Coronary artery disease Mother     family hx of  . Hypertension Mother     hx of   History  Substance Use Topics  . Smoking status: Former Research scientist (life sciences)  . Smokeless tobacco: Not on file  . Alcohol Use: No   OB History   Grav Para Term Preterm Abortions TAB SAB Ect Mult Living                 Review of Systems  All other systems reviewed and are negative.   Allergies  Review of patient's allergies indicates no known  allergies.  Home Medications   Current Outpatient Rx  Name  Route  Sig  Dispense  Refill  . amLODipine (NORVASC) 5 MG tablet   Oral   Take 1 tablet (5 mg total) by mouth daily.   30 tablet   6   . aspirin 325 MG tablet   Oral   Take 325 mg by mouth every 6 (six) hours as needed for pain.         Marland Kitchen gabapentin (NEURONTIN) 600 MG tablet   Oral   Take 1 tablet (600 mg total) by mouth 3 (three) times daily. As needed for pain   90 tablet   6   . hydrochlorothiazide (HYDRODIURIL) 25 MG tablet      TAKE 1 TABLET BY MOUTH DAILY   30 tablet   6   . LORazepam (ATIVAN) 0.5 MG tablet   Oral   Take 1 tablet (0.5 mg total) by mouth 2 (two) times daily as needed for anxiety.   60 tablet   2   . ranitidine (ZANTAC) 150 MG tablet      Take once or twice a day as needed for stomach acid   60 tablet   5   . rosuvastatin (CRESTOR) 20 MG tablet   Oral   Take 1 tablet (20 mg total) by mouth every evening.   30 tablet  6    BP 177/70  Pulse 96  Temp(Src) 98.8 F (37.1 C) (Oral)  Resp 18  Ht 5' (1.524 m)  Wt 135 lb (61.236 kg)  BMI 26.37 kg/m2  SpO2 96%  Physical Exam  Nursing note and vitals reviewed. Constitutional: She is oriented to person, place, and time. She appears well-developed and well-nourished. No distress.  HENT:  Head: Atraumatic.    Right Ear: External ear normal.  Left Ear: External ear normal.  Nose: Nose normal.  Mouth/Throat: Oropharynx is clear and moist and mucous membranes are normal. No trismus in the jaw. Dental abscesses and dental caries present. No uvula swelling. No oropharyngeal exudate.    Marked gingival swelling noted surrounding post of 1st left lower molar (which was being used as part of dental bridge).  Mild left facial soft tissue swelling that does not extend below the mandible.  Eyes: Conjunctivae are normal. No scleral icterus.  Neck: Normal range of motion. Neck supple.  Pulmonary/Chest: Effort normal.  Musculoskeletal:  Normal range of motion. She exhibits no edema and no tenderness.  Lymphadenopathy:    She has no cervical adenopathy.  Neurological: She is alert and oriented to person, place, and time. She exhibits normal muscle tone. Coordination normal.  Skin: Skin is warm and dry. No rash noted. No erythema. No pallor.  Psychiatric: She has a normal mood and affect. Her behavior is normal. Judgment and thought content normal.    ED Course  Procedures (including critical care time)  DIAGNOSTIC STUDIES: Oxygen Saturation is 96% on room air, normal by my interpretation.    COORDINATION OF CARE: 4:40 PM-Discussed treatment plan which includes antibiotic with pt at bedside and pt agreed to plan. Advised pt abscess may open up and drain, so spit out any foul tasting drainage.  Labs Review Labs Reviewed - No data to display Imaging Review No results found.  EKG Interpretation   None      Medications - No data to display  MDM  Dental abscess  Patient here with obvious left 1st molar dental abscess with extension into the left facial area - no trismus, no palpable fluctuant pocket within the face, no evidence of ludwig's angina.  Will start on antibiotics - has appointment with dentist in the morning.  I personally performed the services described in this documentation, which was scribed in my presence. The recorded information has been reviewed and is accurate.   Idalia Needle Joelyn Oms, PA-C 07/23/13 1650

## 2013-07-23 NOTE — ED Notes (Signed)
Pt states has had abscess to bottom L tooth x 4-5 days, swelling noted to bottom L jaw.

## 2013-07-24 NOTE — ED Provider Notes (Signed)
Medical screening examination/treatment/procedure(s) were performed by non-physician practitioner and as supervising physician I was immediately available for consultation/collaboration.  EKG Interpretation   None         Merryl Hacker, MD 07/24/13 [phone]

## 2013-08-02 ENCOUNTER — Other Ambulatory Visit: Payer: Self-pay | Admitting: Family Medicine

## 2013-08-02 DIAGNOSIS — B0229 Other postherpetic nervous system involvement: Secondary | ICD-10-CM

## 2013-08-04 MED ORDER — GABAPENTIN 300 MG PO CAPS: 300.0000 mg | ORAL_CAPSULE | Freq: Two times a day (BID) | ORAL | Status: DC

## 2013-08-04 NOTE — Telephone Encounter (Signed)
Dr Lorelei Pont, you saw pt for med refills in Dec but don't see this discussed. Can we RF?

## 2013-08-04 NOTE — Telephone Encounter (Signed)
Her creat clearance is now 26.  Will need to adjust her dose; max recommended dose is 700mg / day.  Will change to 300mg  BID as needed

## 2013-09-09 ENCOUNTER — Ambulatory Visit (INDEPENDENT_AMBULATORY_CARE_PROVIDER_SITE_OTHER): Payer: Medicare Other | Admitting: Family Medicine

## 2013-09-09 VITALS — BP 120/72 | HR 81 | Temp 98.4°F | Resp 16 | Ht 60.5 in | Wt 132.0 lb

## 2013-09-09 DIAGNOSIS — K219 Gastro-esophageal reflux disease without esophagitis: Secondary | ICD-10-CM

## 2013-09-09 DIAGNOSIS — N289 Disorder of kidney and ureter, unspecified: Secondary | ICD-10-CM

## 2013-09-09 DIAGNOSIS — I10 Essential (primary) hypertension: Secondary | ICD-10-CM

## 2013-09-09 DIAGNOSIS — B0229 Other postherpetic nervous system involvement: Secondary | ICD-10-CM

## 2013-09-09 DIAGNOSIS — F411 Generalized anxiety disorder: Secondary | ICD-10-CM

## 2013-09-09 DIAGNOSIS — E7800 Pure hypercholesterolemia, unspecified: Secondary | ICD-10-CM

## 2013-09-09 MED ORDER — RANITIDINE HCL 150 MG PO TABS: ORAL_TABLET | ORAL | Status: DC

## 2013-09-09 MED ORDER — ROSUVASTATIN CALCIUM 20 MG PO TABS: 20.0000 mg | ORAL_TABLET | Freq: Every evening | ORAL | Status: DC

## 2013-09-09 MED ORDER — GABAPENTIN 300 MG PO CAPS: ORAL_CAPSULE | ORAL | Status: DC

## 2013-09-09 MED ORDER — LORAZEPAM 0.5 MG PO TABS: 0.5000 mg | ORAL_TABLET | Freq: Two times a day (BID) | ORAL | Status: DC | PRN

## 2013-09-09 MED ORDER — AMLODIPINE BESYLATE 5 MG PO TABS: 5.0000 mg | ORAL_TABLET | Freq: Every day | ORAL | Status: DC

## 2013-09-09 MED ORDER — HYDROCHLOROTHIAZIDE 25 MG PO TABS: 25.0000 mg | ORAL_TABLET | Freq: Every day | ORAL | Status: DC

## 2013-09-09 NOTE — Patient Instructions (Addendum)
I am sorry that you are having a hard time with your pain. Your anxiety medication (lorazepam) has remained the same.   The reason we adjusted your neurontin is because your kidneys are not as strong as they once were; this means that a smaller amount of medication has a stronger effect.  Let's see what your kidney function looks like today; if possible I will increase your dose. I will be in touch with you in the next day or two.  If we are not able to increase your dose I will refer you to a neurologist to help [place] find the best day to treat your chronic pain.

## 2013-09-09 NOTE — Progress Notes (Addendum)
This chart was scribed for Kylie Blinks, MD by [name], ED scribe.  This patient was seen in [place] [facility] and the patient's care was started at 5:01 PM.  [facility] [address] [place] [address] [phone]  Date:  09/09/2013   Name:  Kylie Allen   DOB:  August 07, 1923   MRN:  [id number]  PCP:  [name], MD    Chief Complaint: rx refills and Pain   History of Present Illness:  Kylie Allen is a 80 y.o. very pleasant female patient who presents with the following:  Pt here requesting refill of her Neurontin and lorazepam at a higher dosage.  Pt's daughter reports that pt has had chronic left trunk pain due to postherpetic neuralgia for the past 30 years.  She takes Neurontin regularly (300mg  2x/day), and at her last refill her dose was reduced from 600mg  to 300mg  due to her impaired kidney function.  Since then she states her pain has been very poorly managed and she has had no relief at all from the Neurontin at this dose.  She is requesting a refill at a higher dose.  Pt used to see a neurologist for this pain some time ago.  She had a TENS placed at one point which relieved her pain somewhat but was "kind of annoying."  Pt also is concerned that she needs a higher dosage of lorazepam for her anxiety.  She was taking 0.5-mg BID.  We have tried unsuccessfully to decrease her dose.   Per pt's medical record, she is still prescribed 0.5-mg, but she is concerned that she is taking a lower dose, although she is not sure why this may be the case.  She states she is now taking "something like 0.2, or I didn't take as many, or something like that."  She denies getting her medications refilled at a new pharmacy.  She does not have this medication with her but wonders if pharmacy made an error  Daughter states pt lives at home and is independent.  She states pt was functioning well when she was taking her higher dosage of Neurontin but is unpleasant  now that she is in pain.  Pt also states that she needs a refill of most of her other medications because she "misplaced them."  Nephrologist is Dr. [name].  Pt states she is due for an appointment with him.  Most recent creatinine clearance 26    Patient Active Problem List   Diagnosis Date Noted  . Bradycardia 04/19/2011  . HYPERLIPIDEMIA 05/10/2009  . DEPRESSION 05/10/2009  . MIGRAINE HEADACHE 05/10/2009  . HYPERTENSION 05/10/2009  . GERD 05/10/2009  . CHRONIC KIDNEY DISEASE UNSPECIFIED 05/10/2009  . BREAST CANCER, HX OF 05/10/2009  . SYNCOPE, HX OF 05/10/2009  . DIVERTICULITIS, HX OF 05/10/2009  . UTI'S, HX OF 05/10/2009  . CHICKENPOX, HX OF 05/10/2009    Past Medical History  Diagnosis Date  . Breast cancer     hx of  . Depression with anxiety   . Diverticulosis     hx of  . GERD (gastroesophageal reflux disease)   . Hyperlipidemia   . Hypertension   . CKD (chronic kidney disease)   . H/O: hysterectomy   . Bradycardia   . Fatigue     Past Surgical History  Procedure Laterality Date  . Mastectomy Right     History  Substance Use Topics  . Smoking status: Former Research scientist (life sciences)  . Smokeless tobacco: Not on file  .  Alcohol Use: No    Family History  Problem Relation Age of Onset  . Coronary artery disease Mother     family hx of  . Hypertension Mother     hx of    No Known Allergies  Medication list has been reviewed and updated.  Current Outpatient Prescriptions on File Prior to Visit  Medication Sig Dispense Refill  . aspirin 325 MG tablet Take 325 mg by mouth every 6 (six) hours as needed for pain.      Marland Kitchen gabapentin (NEURONTIN) 600 MG tablet Take 1 tablet (600 mg total) by mouth 3 (three) times daily. As needed for pain  90 tablet  6  . Multiple Vitamin (MULTIVITAMIN WITH MINERALS) TABS tablet Take 1 tablet by mouth daily.       No current facility-administered medications on file prior to visit.    Review of Systems:  As per HPI- otherwise  negative.   Physical Examination: Filed Vitals:   09/09/13 1633  BP: 120/72  Pulse: 81  Temp: 98.4 F (36.9 C)  Resp: 16    Body mass index is 25.35 kg/(m^2).   GEN: WDWN, NAD, Non-toxic, A & O x 3, older lady who appears her usual self. Looks well HEENT: Atraumatic, Normocephalic. Neck supple. No masses, No LAD. Ears and Nose: No external deformity. CV: RRR, No M/G/R. No JVD. No thrill. No extra heart sounds. PULM: CTA B, no wheezes, crackles, rhonchi. No retractions. No resp. distress. No accessory muscle use. EXTR: No c/c/e NEURO Normal gait.  PSYCH: Normally interactive. Conversant. Not depressed or anxious appearing.  Calm demeanor.    Assessment and Plan: Renal insufficiency - Plan: Comprehensive metabolic panel, CANCELED: Basic metabolic panel  Generalized anxiety disorder - Plan: LORazepam (ATIVAN) 0.5 MG tablet  HZV (herpes zoster virus) post herpetic neuralgia - Plan: gabapentin (NEURONTIN) 300 MG capsule  High cholesterol - Plan: rosuvastatin (CRESTOR) 20 MG tablet  GERD (gastroesophageal reflux disease) - Plan: ranitidine (ZANTAC) 150 MG tablet  HTN (hypertension) - Plan: hydrochlorothiazide (HYDRODIURIL) 25 MG tablet, amLODipine (NORVASC) 5 MG tablet   Refilled her lorazepam; explained that this dose has not changed per my knowledge   Explained my reasoning behind decreasing her neurontin dose.  I had tried but failed to reach her by phone last month when I changed the rx.  She is still not convinced that this change is necessary.  They want to know what else can be done for her pain.  Explained that given her age and concurrent lorazepam use (I have tried to convince her to taper this medication but she has not felt she could do so) I do not want to use narcotics.  Will touch base with her nephrologist to get his opinion.  Recheck labs today, perhaps her cr cl is better; then I will increase her dose Refilled other medications for her as above Will plan  further follow- up pending labs.  Signed Kylie Blinks, MD  3/26: called regarding her recent labs.  She states that actually she is not in much more pain since we decreased her neurontin dose- that her daughter sometimes gets "really excited."  However, she would like to try something else to see if it might help more.  We will try having her use lyrica instead.  Current creat clearance is approx 27.  She will take 150 neurontin BID for 3 or 4 days, then take nothing for 1 or 2 days, then start lyrica 25 BID

## 2013-09-10 LAB — COMPREHENSIVE METABOLIC PANEL
ALT: 14 U/L (ref 0–35)
AST: 23 U/L (ref 0–37)
Albumin: 4.2 g/dL (ref 3.5–5.2)
Alkaline Phosphatase: 53 U/L (ref 39–117)
BUN: 34 mg/dL — ABNORMAL HIGH (ref 6–23)
CHLORIDE: 101 meq/L (ref 96–112)
CO2: 31 meq/L (ref 19–32)
Calcium: 9.8 mg/dL (ref 8.4–10.5)
Creat: 1.33 mg/dL — ABNORMAL HIGH (ref 0.50–1.10)
Glucose, Bld: 122 mg/dL — ABNORMAL HIGH (ref 70–99)
Potassium: 3.7 mEq/L (ref 3.5–5.3)
Sodium: 143 mEq/L (ref 135–145)
TOTAL PROTEIN: 6.8 g/dL (ref 6.0–8.3)
Total Bilirubin: 0.4 mg/dL (ref 0.2–1.2)

## 2013-09-11 ENCOUNTER — Telehealth: Payer: Self-pay | Admitting: *Deleted

## 2013-09-11 MED ORDER — PREGABALIN 25 MG PO CAPS: 25.0000 mg | ORAL_CAPSULE | Freq: Two times a day (BID) | ORAL | Status: DC

## 2013-09-11 NOTE — Addendum Note (Signed)
Addended by: [name] C on: 09/11/2013 04:32 PM   Modules accepted: Orders, Medications

## 2013-09-11 NOTE — Telephone Encounter (Signed)
Prescription called to [facility] voice mail.

## 2013-09-11 NOTE — Telephone Encounter (Signed)
Called prescription LYRICA 25 mg to [facility] [address] voice mail, per patient's pharmacy in [place].

## 2013-09-11 NOTE — Telephone Encounter (Signed)
Called prescription to the correct pharmacy at [address], and advise the [facility] at [facility] [address] at [employer] to cancel the RX.

## 2013-09-12 ENCOUNTER — Encounter: Payer: Self-pay | Admitting: Family Medicine

## 2013-09-12 ENCOUNTER — Encounter: Payer: Self-pay | Admitting: Nephrology

## 2013-12-09 ENCOUNTER — Other Ambulatory Visit: Payer: Self-pay | Admitting: Family Medicine

## 2013-12-09 DIAGNOSIS — F411 Generalized anxiety disorder: Secondary | ICD-10-CM

## 2013-12-17 ENCOUNTER — Ambulatory Visit (INDEPENDENT_AMBULATORY_CARE_PROVIDER_SITE_OTHER): Payer: Medicare Other | Admitting: Family Medicine

## 2013-12-17 VITALS — BP 165/63 | HR 68 | Temp 97.6°F | Resp 16 | Ht 60.0 in | Wt 127.0 lb

## 2013-12-17 DIAGNOSIS — I10 Essential (primary) hypertension: Secondary | ICD-10-CM

## 2013-12-17 DIAGNOSIS — F0390 Unspecified dementia without behavioral disturbance: Secondary | ICD-10-CM

## 2013-12-17 DIAGNOSIS — I499 Cardiac arrhythmia, unspecified: Secondary | ICD-10-CM

## 2013-12-17 DIAGNOSIS — R0602 Shortness of breath: Secondary | ICD-10-CM

## 2013-12-17 NOTE — Progress Notes (Signed)
[facility] [address], [place] [address] [phone]  Date:  12/17/2013   Name:  Kylie Allen   DOB:  01-30-1924   MRN:  [id number]  PCP:  [name], MD    Chief Complaint: rx refills   History of Present Illness:  Kylie Allen is a 80 y.o. very pleasant female patient who presents with the following:  Here today for mediction refills.  I have tried several times but been unable to get her to decrease or DC her ativan use.  She needs refills of gabapentin, hctz, ativan, crestor, amlodipine. She picked up a RF of her ativan earlier today and has it with her.  I did give her refills of her other medications for a year in March- [facility] to make sure these [facility] are [facility] for her.  Per pharmD she does have refills remaining and I asked him to fill these for her.   [name]'s confusion seems to be getting worse.  She is not delirious but is clearly having more trouble managing her medications.  Also she has come complaint of SOB of undetermined duration. "it keeps getting worse every day"  Noted irregular rhythm on cardiac exam.  She did see Dr. Burt Knack with cardiology a few years ago, but admits she did not follow-up.     Patient Active Problem List   Diagnosis Date Noted  . Bradycardia 04/19/2011  . HYPERLIPIDEMIA 05/10/2009  . DEPRESSION 05/10/2009  . MIGRAINE HEADACHE 05/10/2009  . HYPERTENSION 05/10/2009  . GERD 05/10/2009  . CHRONIC KIDNEY DISEASE UNSPECIFIED 05/10/2009  . BREAST CANCER, HX OF 05/10/2009  . SYNCOPE, HX OF 05/10/2009  . DIVERTICULITIS, HX OF 05/10/2009  . UTI'S, HX OF 05/10/2009  . CHICKENPOX, HX OF 05/10/2009    Past Medical History  Diagnosis Date  . Breast cancer     hx of  . Depression with anxiety   . Diverticulosis     hx of  . GERD (gastroesophageal reflux disease)   . Hyperlipidemia   . Hypertension   . CKD (chronic kidney disease)   . H/O: hysterectomy   . Bradycardia   . Fatigue      Past Surgical History  Procedure Laterality Date  . Mastectomy Right     History  Substance Use Topics  . Smoking status: Former Research scientist (life sciences)  . Smokeless tobacco: Not on file  . Alcohol Use: No    Family History  Problem Relation Age of Onset  . Coronary artery disease Mother     family hx of  . Hypertension Mother     hx of    No Known Allergies  Medication list has been reviewed and updated.  Current Outpatient Prescriptions on File Prior to Visit  Medication Sig Dispense Refill  . amLODipine (NORVASC) 5 MG tablet Take 1 tablet (5 mg total) by mouth daily.  90 tablet  3  . aspirin 325 MG tablet Take 325 mg by mouth every 6 (six) hours as needed for pain.      Marland Kitchen gabapentin (NEURONTIN) 300 MG capsule Take one twice a day or as directed. For post- shingles pain  180 capsule  3  . hydrochlorothiazide (HYDRODIURIL) 25 MG tablet Take 1 tablet (25 mg total) by mouth daily.  90 tablet  3  . LORazepam (ATIVAN) 0.5 MG tablet TAKE 1 TABLET BY MOUTH TWICE DAILY AS NEEDED FOR ANXIETY  60 tablet  2  . rosuvastatin (CRESTOR) 20 MG tablet Take 1 tablet (20  mg total) by mouth every evening.  90 tablet  3  . Multiple Vitamin (MULTIVITAMIN WITH MINERALS) TABS tablet Take 1 tablet by mouth daily.      . pregabalin (LYRICA) 25 MG capsule Take 1 capsule (25 mg total) by mouth 2 (two) times daily. Increase as directed.  60 capsule  1  . ranitidine (ZANTAC) 150 MG tablet Take once or twice a day as needed for stomach acid  180 tablet  3   No current facility-administered medications on file prior to visit.    Review of Systems:  As per HPI- otherwise negative.   Physical Examination: Filed Vitals:   12/17/13 1536  BP: 165/63  Pulse: 68  Temp: 97.6 F (36.4 C)  Resp: 16   Filed Vitals:   12/17/13 1536  Height: 5' (1.524 m)  Weight: 127 lb (57.607 kg)   Body mass index is 24.8 kg/(m^2). Ideal Body Weight: Weight in (lb) to have BMI = 25: 127.7  GEN: WDWN, NAD, Non-toxic, A & O x  3, looks well, neatly dressed and accompanied by her daughter HEENT: Atraumatic, Normocephalic. Neck supple. No masses, No LAD. Ears and Nose: No external deformity. CV: irregular rhythm. No M/G/R. No JVD. No thrill. No extra heart sounds. PULM: CTA B, no wheezes, crackles, rhonchi. No retractions. No resp. distress. No accessory muscle use. ABD: S, NT, ND, +BS. No rebound. No HSM. EXTR: No c/c/e NEURO Normal gait.  PSYCH: Normally interactive. Conversant. Not depressed or anxious appearing.  Calm demeanor.   EKG:  Frequent PVCs with bigeminy and evidence of LVH  Here today with her daughter.  She continues to seem more confused.  Her daughter notes that her confusion has become worse over the last 2 years.  She is still living on her own in an independent senior apartment. They have been unable to find an assisted living facility that she likes so far  Assessment and Plan: Irregular heart beat - Plan: EKG 12-Lead  Senile dementia, without behavioral disturbance - Plan: Basic metabolic panel, CBC  Essential hypertension, benign  SOB (shortness of breath) - Plan: Brain natriuretic peptide  Kylie Allen came in today requesting medication refills. explained to her and her daughter that these refills are available to her (she just needs to notify pharmacy when hse is out). They will go and pick up her meds. It is clear to me that her confusion is getting worse.  Encouraged her daughter to work towards placement in assisted living, and gave information for the "[facility]" office to help with finding a good place for her.   She is having frequent PVC/s bigeminy.  This is non- emergent but does need cardiology follow-up.  Will call [facility] tomorrow and work on getting her an appt.  Also check BNP due to her feeling of SOB.  I do not hear fluid in her lungs but certainly she is at risk for CHF.    Signed [name], MD

## 2013-12-17 NOTE — Patient Instructions (Addendum)
I do not think that you should continue to live alone.   I am afraid you could have an accident at home and there would not be anyone to help you.  Please have your family look into assisted living care for you- if you like [facility] [employer] can help you find some good options [phone]  We need to have you see cardiology soon- I will get in touch with them and ask them to see you soon.    We are doing some labs for you today and I will be in touch with your results asap

## 2013-12-18 ENCOUNTER — Telehealth: Payer: Self-pay | Admitting: Family Medicine

## 2013-12-18 DIAGNOSIS — E876 Hypokalemia: Secondary | ICD-10-CM

## 2013-12-18 DIAGNOSIS — I499 Cardiac arrhythmia, unspecified: Principal | ICD-10-CM

## 2013-12-18 DIAGNOSIS — I498 Other specified cardiac arrhythmias: Secondary | ICD-10-CM

## 2013-12-18 LAB — CBC
HCT: 38.4 % (ref 36.0–46.0)
Hemoglobin: 12.9 g/dL (ref 12.0–15.0)
MCH: 30.2 pg (ref 26.0–34.0)
MCHC: 33.6 g/dL (ref 30.0–36.0)
MCV: 89.9 fL (ref 78.0–100.0)
PLATELETS: 306 10*3/uL (ref 150–400)
RBC: 4.27 MIL/uL (ref 3.87–5.11)
RDW: 13.6 % (ref 11.5–15.5)
WBC: 8.6 10*3/uL (ref 4.0–10.5)

## 2013-12-18 LAB — BASIC METABOLIC PANEL
BUN: 24 mg/dL — AB (ref 6–23)
CALCIUM: 10.2 mg/dL (ref 8.4–10.5)
CO2: 27 mEq/L (ref 19–32)
CREATININE: 1.39 mg/dL — AB (ref 0.50–1.10)
Chloride: 96 mEq/L (ref 96–112)
GLUCOSE: 95 mg/dL (ref 70–99)
POTASSIUM: 3.2 meq/L — AB (ref 3.5–5.3)
Sodium: 141 mEq/L (ref 135–145)

## 2013-12-18 LAB — BRAIN NATRIURETIC PEPTIDE: Brain Natriuretic Peptide: 44 pg/mL (ref 0.0–100.0)

## 2013-12-18 NOTE — Telephone Encounter (Signed)
Called and spoke with DOD at [facility]- as it will be a little while before her appt we will go ahead and order a holter and echo.  Received her labs- the only number that seemed to work is her daughter's home number.  Left a detailed message with her that we will schedule her echo and holter, her cardiology appt info, and the fact that her K is low. This may be due to HCTZ- will need to call her back and discuss this further  Results for orders placed in visit on 12/17/13  BRAIN NATRIURETIC PEPTIDE      Result Value Ref Range   Brain Natriuretic Peptide 44.0  0.0 - 100.0 pg/mL  BASIC METABOLIC PANEL      Result Value Ref Range   Sodium 141  135 - 145 mEq/L   Potassium 3.2 (*) 3.5 - 5.3 mEq/L   Chloride 96  96 - 112 mEq/L   CO2 27  19 - 32 mEq/L   Glucose, Bld 95  70 - 99 mg/dL   BUN 24 (*) 6 - 23 mg/dL   Creat 1.39 (*) 0.50 - 1.10 mg/dL   Calcium 10.2  8.4 - 10.5 mg/dL  CBC      Result Value Ref Range   WBC 8.6  4.0 - 10.5 K/uL   RBC 4.27  3.87 - 5.11 MIL/uL   Hemoglobin 12.9  12.0 - 15.0 g/dL   HCT 38.4  36.0 - 46.0 %   MCV 89.9  78.0 - 100.0 fL   MCH 30.2  26.0 - 34.0 pg   MCHC 33.6  30.0 - 36.0 g/dL   RDW 13.6  11.5 - 15.5 %   Platelets 306  150 - 400 K/uL

## 2013-12-18 NOTE — Telephone Encounter (Addendum)
Called [facility] and asked for a follow-up appt.  Was able to make an appt or 7/22, Wednesday at 12:10 pm.

## 2013-12-20 MED ORDER — POTASSIUM CHLORIDE ER 10 MEQ PO TBCR: 10.0000 meq | EXTENDED_RELEASE_TABLET | Freq: Every day | ORAL | Status: DC

## 2013-12-20 NOTE — Telephone Encounter (Signed)
Called again- again [facility] with Kylie Allen.  This time [name]'s cell number did work this time and I was able to speak with her. Gave her information regarding her upcoming cardiology appointment.  She states she has been taking her HCTZ; explained that her K is a little bit low She will take K replacement as rx, and plan lab visit only for repeat BMP in one week  Meds ordered this encounter  Medications  . potassium chloride (K-DUR) 10 MEQ tablet    Sig: Take 1 tablet (10 mEq total) by mouth daily.    Dispense:  30 tablet    Refill:  1

## 2013-12-21 ENCOUNTER — Encounter: Payer: Self-pay | Admitting: Family Medicine

## 2013-12-24 ENCOUNTER — Encounter: Payer: Self-pay | Admitting: *Deleted

## 2013-12-24 ENCOUNTER — Encounter (INDEPENDENT_AMBULATORY_CARE_PROVIDER_SITE_OTHER): Payer: 59

## 2013-12-24 DIAGNOSIS — I498 Other specified cardiac arrhythmias: Secondary | ICD-10-CM

## 2013-12-24 DIAGNOSIS — I499 Cardiac arrhythmia, unspecified: Principal | ICD-10-CM

## 2013-12-24 DIAGNOSIS — I4949 Other premature depolarization: Secondary | ICD-10-CM

## 2013-12-24 NOTE — Progress Notes (Signed)
Patient ID: Kylie Allen, female   DOB: 06-29-1923, 80 y.o.   MRN: [id number] EVO 24 hour holter monitor applied to patient.

## 2014-01-02 ENCOUNTER — Encounter: Payer: Self-pay | Admitting: *Deleted

## 2014-01-02 ENCOUNTER — Telehealth: Payer: Self-pay

## 2014-01-02 MED ORDER — METOPROLOL TARTRATE 25 MG PO TABS: 12.5000 mg | ORAL_TABLET | Freq: Two times a day (BID) | ORAL | Status: DC

## 2014-01-02 NOTE — Telephone Encounter (Signed)
I spoke with the pt and made her aware of monitor results: Per Dr Burt Knack monitor shows NSR with frequent PVC's, few runs of PSVT, upcoming appt with Richardson Dopp PA-C.  Would add Metoprolol Tartrate 12.5mg  twice a day.  I will send in Rx for Metoprolol Tartrate 25mg  take one-half tablet by mouth twice a day and the pt will keep her upcoming appointments as scheduled.

## 2014-01-06 ENCOUNTER — Other Ambulatory Visit (HOSPITAL_COMMUNITY): Payer: Medicare Other

## 2014-01-07 ENCOUNTER — Encounter: Payer: Self-pay | Admitting: Physician Assistant

## 2014-01-07 ENCOUNTER — Ambulatory Visit (INDEPENDENT_AMBULATORY_CARE_PROVIDER_SITE_OTHER): Payer: 59 | Admitting: Physician Assistant

## 2014-01-07 ENCOUNTER — Ambulatory Visit: Payer: Medicare Other | Admitting: Physician Assistant

## 2014-01-07 VITALS — BP 142/72 | HR 63 | Ht 60.0 in | Wt 133.0 lb

## 2014-01-07 DIAGNOSIS — N189 Chronic kidney disease, unspecified: Secondary | ICD-10-CM

## 2014-01-07 DIAGNOSIS — I498 Other specified cardiac arrhythmias: Secondary | ICD-10-CM

## 2014-01-07 DIAGNOSIS — I471 Supraventricular tachycardia, unspecified: Secondary | ICD-10-CM

## 2014-01-07 DIAGNOSIS — I493 Ventricular premature depolarization: Secondary | ICD-10-CM

## 2014-01-07 DIAGNOSIS — E785 Hyperlipidemia, unspecified: Secondary | ICD-10-CM

## 2014-01-07 DIAGNOSIS — R0602 Shortness of breath: Secondary | ICD-10-CM

## 2014-01-07 DIAGNOSIS — I10 Essential (primary) hypertension: Secondary | ICD-10-CM

## 2014-01-07 DIAGNOSIS — I4949 Other premature depolarization: Secondary | ICD-10-CM

## 2014-01-07 NOTE — Progress Notes (Signed)
Cardiology Office Note    Date:  01/07/2014   ID:  Kylie Allen, DOB 09/09/1923, MRN [id number]  PCP:  [name], MD  Cardiologist:  Dr. Sherren Mocha      History of Present Illness: Kylie Allen is a 80 y.o. female with a history of HTN, HL, chronic kidney disease. She was evaluated by Dr. Burt Knack in 2012 for bradycardia and fatigue.  Patient underwent Holter monitor which demonstrated sinus rhythm and no sustained arrhythmias.  She was seen by primary care recently. She was noted to have frequent PVCs. Holter monitor was arranged. This demonstrated sinus rhythm with frequent PVCs and a few runs of paroxysmal supraventricular tachycardia. Dr. Burt Knack recommended the addition of metoprolol tartrate 12.5 mg twice a day. She returns for followup. Of note, recent potassium was low and supplementation was initiated a primary care  She is here alone.  She is living in a "senior center."  She tells me this is an independent living facility.  She does her own cleaning.  She denies chest pain.  She does note dyspnea with certain activities.  She is probably NYHA 2b.  She denies orthopnea, PND, edema.  She denies syncope. She denies dizziness.  She denies near syncope. She has noted occasional rapid palpitations since childhood.   Recent Labs: 09/09/2013: ALT 14  12/17/2013: Creatinine 1.39*; Hemoglobin 12.9; Potassium 3.2*   Wt Readings from Last 3 Encounters:  01/07/14 133 lb (60.328 kg)  12/17/13 127 lb (57.607 kg)  09/09/13 132 lb (59.875 kg)     Past Medical History  Diagnosis Date  . Breast cancer     hx of  . Depression with anxiety   . Diverticulosis     hx of  . GERD (gastroesophageal reflux disease)   . Hyperlipidemia   . Hypertension   . CKD (chronic kidney disease)   . H/O: hysterectomy   . Bradycardia   . Fatigue     Current Outpatient Prescriptions  Medication Sig Dispense Refill  . amLODipine (NORVASC) 5 MG tablet Take 1 tablet (5 mg total) by mouth daily.   90 tablet  3  . aspirin 325 MG tablet Take 325 mg by mouth every 6 (six) hours as needed for pain.      Marland Kitchen gabapentin (NEURONTIN) 300 MG capsule Take one twice a day or as directed. For post- shingles pain  180 capsule  3  . hydrochlorothiazide (HYDRODIURIL) 25 MG tablet Take 1 tablet (25 mg total) by mouth daily.  90 tablet  3  . LORazepam (ATIVAN) 0.5 MG tablet TAKE 1 TABLET BY MOUTH TWICE DAILY AS NEEDED FOR ANXIETY  60 tablet  2  . metoprolol tartrate (LOPRESSOR) 25 MG tablet Take 0.5 tablets (12.5 mg total) by mouth 2 (two) times daily.  30 tablet  6  . Multiple Vitamin (MULTIVITAMIN WITH MINERALS) TABS tablet Take 1 tablet by mouth daily.      . potassium chloride (K-DUR,KLOR-CON) 10 MEQ tablet Take 10 mEq by mouth daily.       . ranitidine (ZANTAC) 150 MG tablet Take once or twice a day as needed for stomach acid  180 tablet  3  . rosuvastatin (CRESTOR) 20 MG tablet Take 1 tablet (20 mg total) by mouth every evening.  90 tablet  3   No current facility-administered medications for this visit.    Allergies:   Review of patient's allergies indicates no known allergies.   Social History:  The patient  reports that she has quit smoking.  She does not have any smokeless tobacco history on file. She reports that she does not drink alcohol.   Family History:  The patient's family history includes Coronary artery disease in her mother; Hypertension in her mother.   ROS:  Please see the history of present illness.      All other systems reviewed and negative.   PHYSICAL EXAM: VS:  BP 142/72  Pulse 63  Ht 5' (1.524 m)  Wt 133 lb (60.328 kg)  BMI 25.97 kg/m2 Well nourished, well developed, in no acute distress HEENT: normal Neck: no JVD Cardiac:  normal S1, S2; RRR; no murmur Lungs:  clear to auscultation bilaterally, no wheezing, rhonchi or rales Abd: soft, nontender, no hepatomegaly Ext: no edema Skin: warm and dry Neuro:  CNs 2-12 intact, no focal abnormalities noted  EKG:  NSR, HR  63, LAD, NSSTTW changes      ASSESSMENT AND PLAN:  PVC's (premature ventricular contractions)  ECG does not demonstrate further PVCs.  I suspect this was, at least partly, related to hypokalemia.  She is now on supplementation. She is also on low dose beta blocker.  She is scheduled for an echo.  Continue current Rx.  SVT (supraventricular tachycardia) She has likely had this her whole life.  She is now on low dose beta blocker.  Echo is pending.  Will see how she is doing in follow up.  Consider repeat Holter after taking beta blocker for several mos.  Shortness of breath Recent BNP normal.  Echo is pending.  If no structural or valvular abnormalities, no further cardiac workup necessary.  HYPERTENSION Fair control.  CHRONIC KIDNEY DISEASE UNSPECIFIED Follow up with nephrology.  HYPERLIPIDEMIA  Continue statin   Disposition:  F/u with Dr. Sherren Mocha in 2-3 mos.  Signed, Kylie Allen, MHS 01/07/2014 3:32 PM    [facility] [place], [place], [place]  [address] Phone: [phone]; Fax: [phone]

## 2014-01-07 NOTE — Patient Instructions (Addendum)
Your physician recommends that you continue on your current medications as directed. Please refer to the Current Medication list given to you today.  Your physician has requested that you have an echocardiogram. Echocardiography is a painless test that uses sound waves to create images of your heart. It provides your doctor with information about the size and shape of your heart and how well your heart's chambers and valves are working. This procedure takes approximately one hour. There are no restrictions for this procedure. You are scheduled on July 27th 2015  Your physician recommends that you schedule a follow-up appointment in: 2 months with Dr Burt Knack

## 2014-01-12 ENCOUNTER — Other Ambulatory Visit (HOSPITAL_COMMUNITY): Payer: Medicare Other

## 2014-01-21 ENCOUNTER — Ambulatory Visit (HOSPITAL_COMMUNITY): Payer: Medicare Other | Attending: Cardiovascular Disease | Admitting: Radiology

## 2014-01-21 DIAGNOSIS — I498 Other specified cardiac arrhythmias: Secondary | ICD-10-CM

## 2014-01-21 DIAGNOSIS — I359 Nonrheumatic aortic valve disorder, unspecified: Secondary | ICD-10-CM | POA: Diagnosis not present

## 2014-01-21 DIAGNOSIS — I499 Cardiac arrhythmia, unspecified: Secondary | ICD-10-CM

## 2014-01-21 DIAGNOSIS — I079 Rheumatic tricuspid valve disease, unspecified: Secondary | ICD-10-CM | POA: Diagnosis not present

## 2014-01-21 DIAGNOSIS — R9431 Abnormal electrocardiogram [ECG] [EKG]: Secondary | ICD-10-CM | POA: Diagnosis present

## 2014-01-21 NOTE — Progress Notes (Signed)
Echocardiogram performed.  

## 2014-01-23 ENCOUNTER — Encounter: Payer: Self-pay | Admitting: Physician Assistant

## 2014-01-26 IMAGING — CT CT ABD-PELV W/ CM
1 of 3 series · 14 of 32 positions shown, 19 images · IV contrast (OMNIPAQUE 300)
Comparison: None

CLINICAL DATA: Lower abdominal pain

CT ABDOMEN AND PELVIS WITH CONTRAST
TECHNIQUE: Multidetector CT imaging of the abdomen and pelvis was
performed following the standard protocol during bolus
administration of intravenous contrast.
Contrast: 80mL OMNIPAQUE IOHEXOL 300 MG/ML  SOLN, 1 OMNIPAQUE
IOHEXOL 300 MG/ML  SOLN, 1 OMNIPAQUE IOHEXOL 300 MG/ML  SOLN

[Series 2: abd/pel with · axial · 0.78mm/px · z∈[+985,+1365]mm · 14 of 88 slices shown, 19 images]
[im 6/88  soft-tissue]
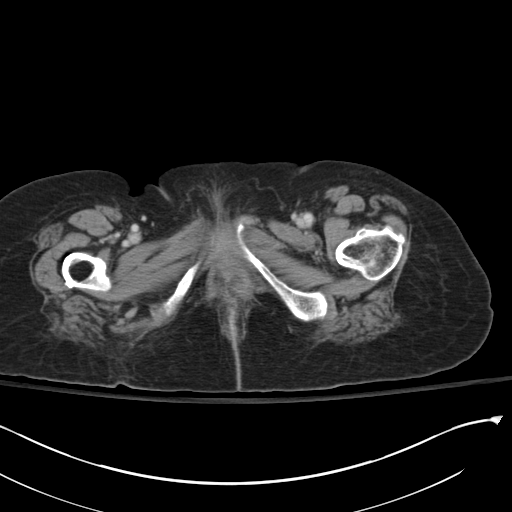
[im 6/88  bone]
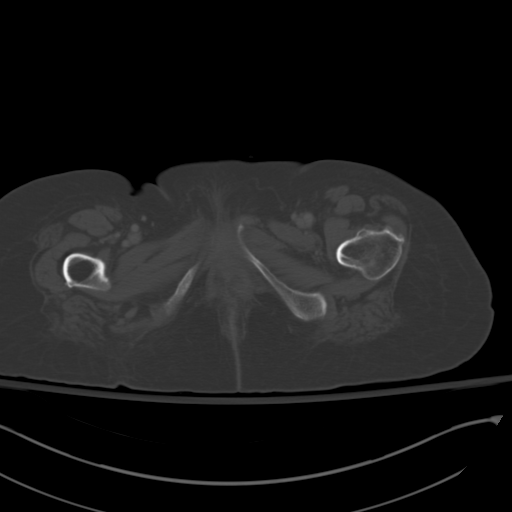
[im 11/88  soft-tissue]
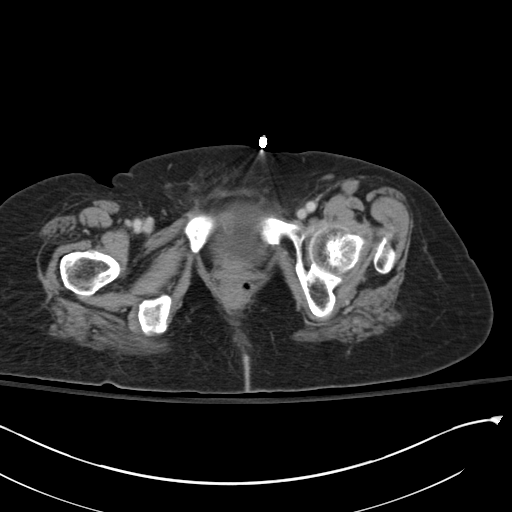
[im 21/88  soft-tissue]
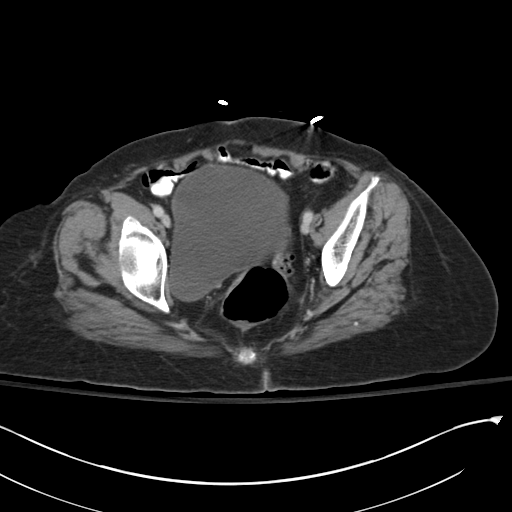
[im 26/88  soft-tissue]
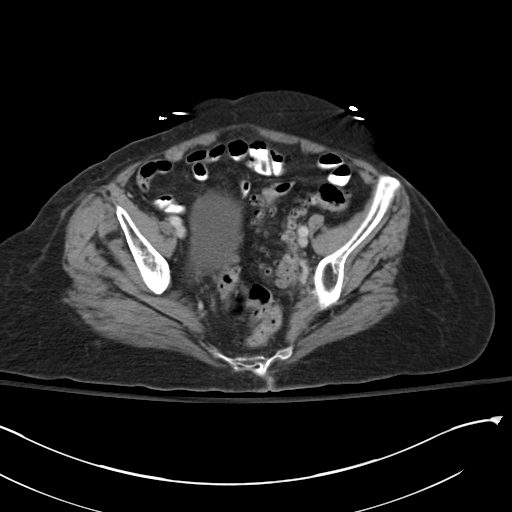
[im 31/88  soft-tissue]
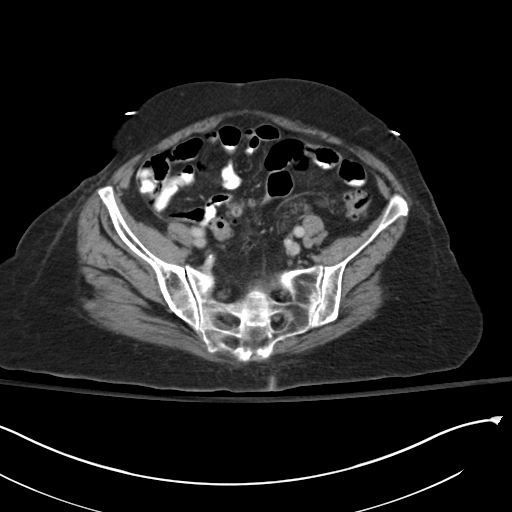
[im 36/88  soft-tissue]
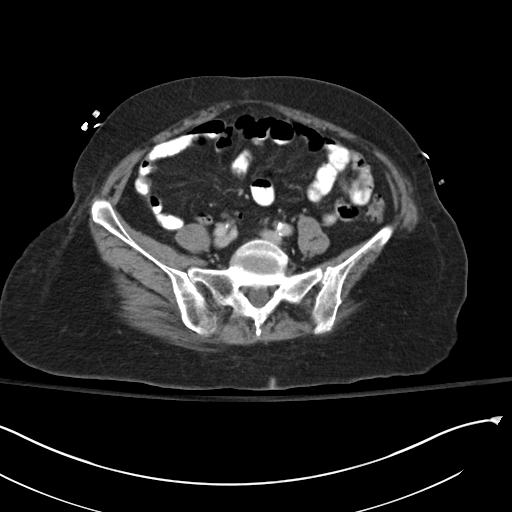
[im 47/88  soft-tissue]
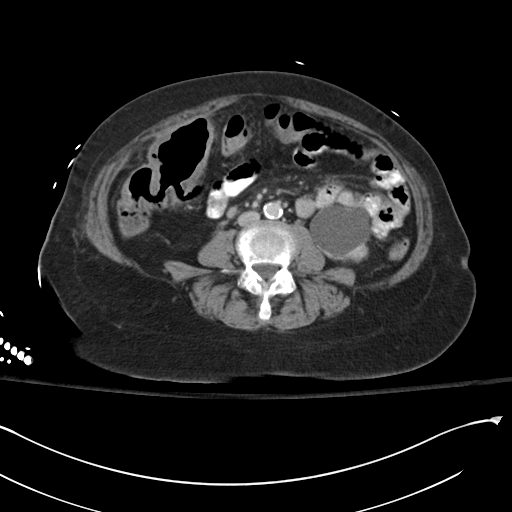
[im 52/88  soft-tissue]
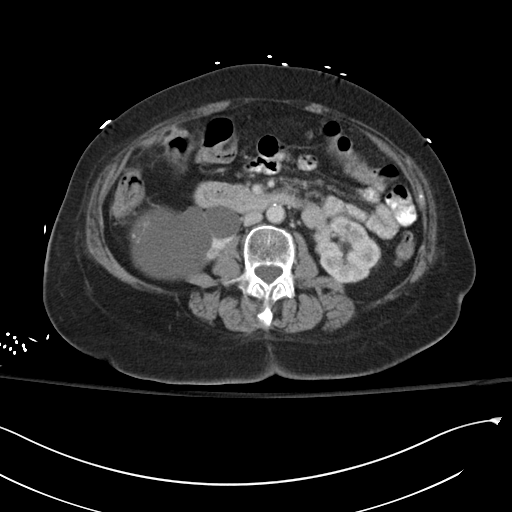
[im 57/88  soft-tissue]
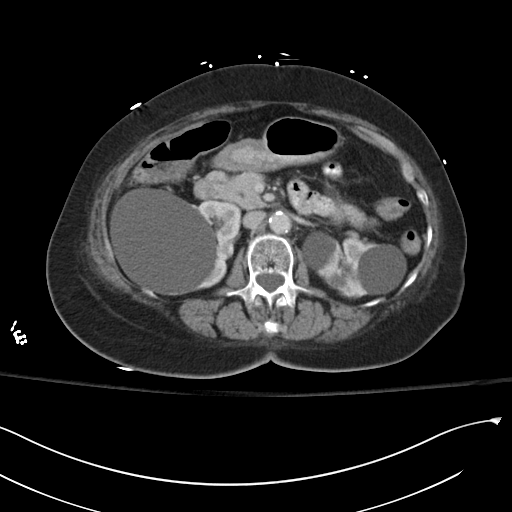
[im 57/88  bone]
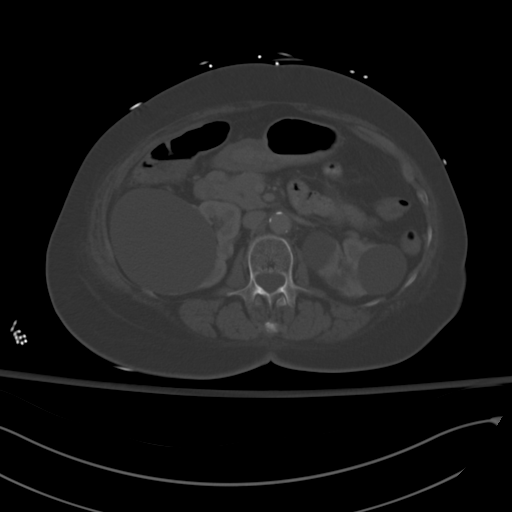
[im 62/88  soft-tissue]
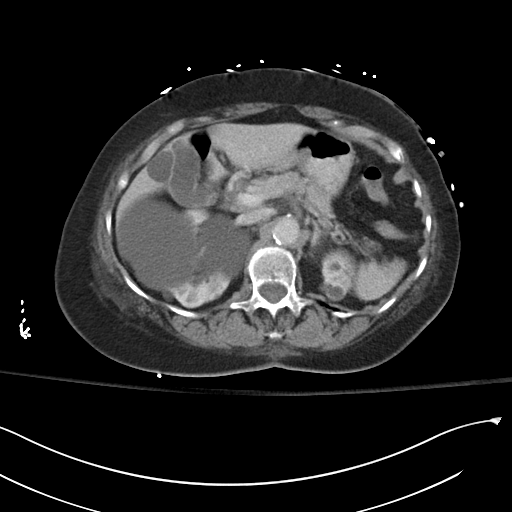
[im 67/88  soft-tissue]
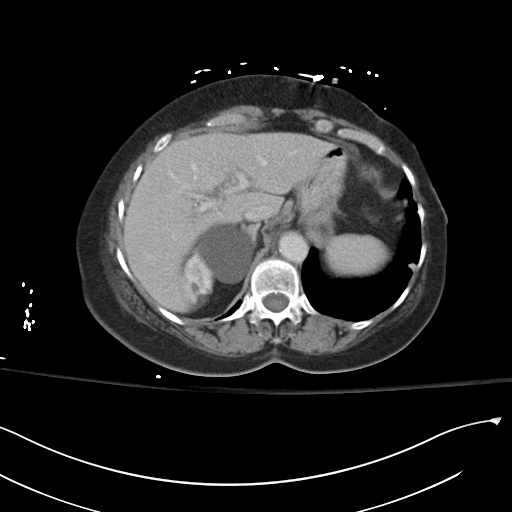
[im 67/88  lung]
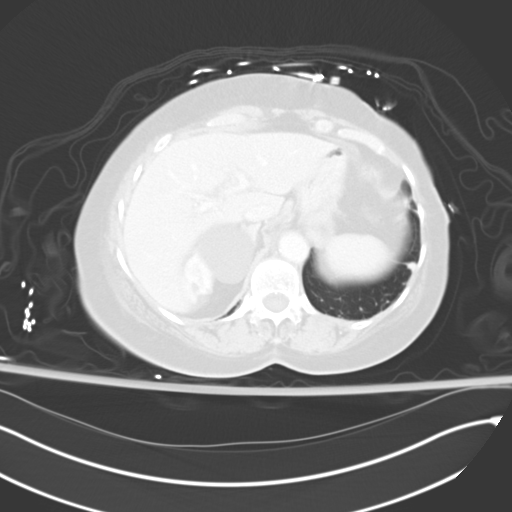
[im 72/88  lung]
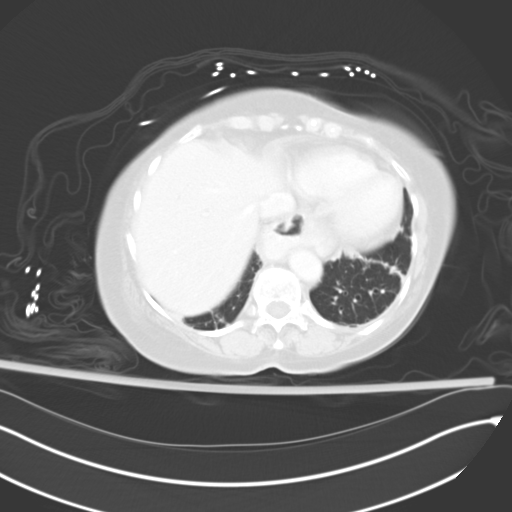
[im 77/88  soft-tissue]
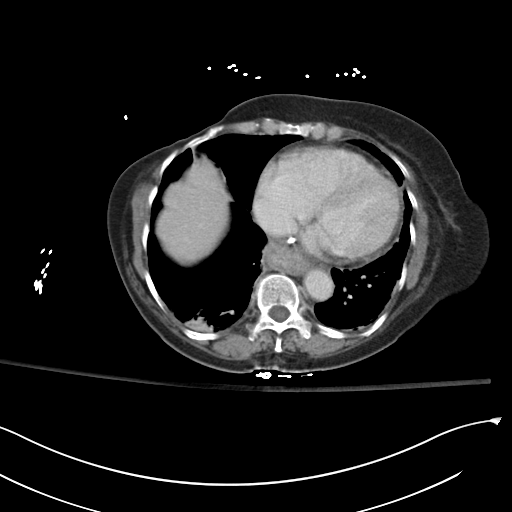
[im 77/88  lung]
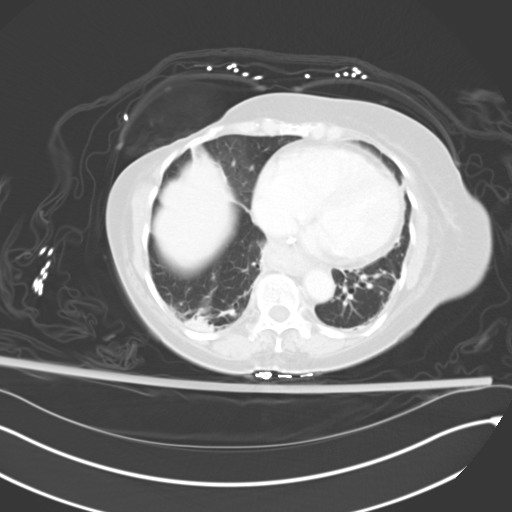
[im 82/88  soft-tissue]
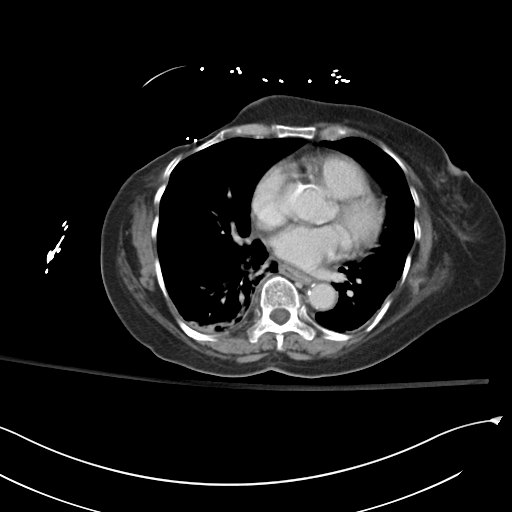
[im 82/88  lung]
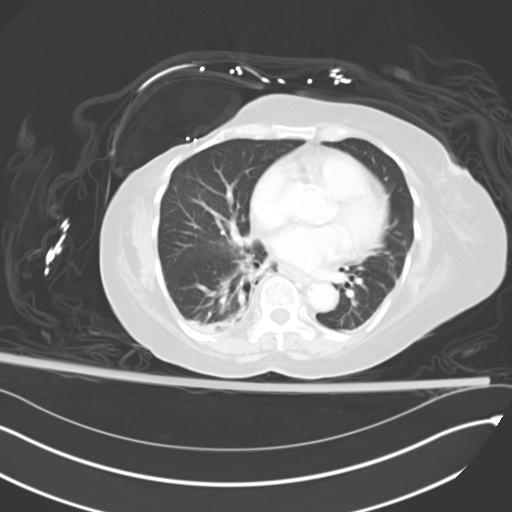

[14 of 32 positions shown; findings below may reference images not displayed]

FINDINGS: Bibasilar opacities, favor scarring or atelectasis.
Right lower lobe nodular density measuring 3 mm.  Right mastectomy.
Heart size upper normal to mildly enlarged.  Coronary artery and
aortic valve calcification.  Small to moderate hiatal hernia.

Hepatic cyst adjacent to the gallbladder fossa.  Thin-walled
gallbladder.  No biliary ductal dilatation.  Unremarkable spleen.
Minimal fullness of the main pancreatic duct up to 3 mm,
nonspecific.  No obstructing lesion is visualized.

Unremarkable adrenal glands.

Bilateral renal cysts, the larger of which measure water
attenuation.} Some contain thin internal septations.  There is
nonspecific peripheral calcification along the inferior margin of a
dominant cyst on the right.

No hydronephrosis or hydroureter.

Colonic diverticulosis.  No CT evidence for colitis or
diverticulitis.  Appendix not identified.  No right lower quadrant
inflammation.

No free intraperitoneal air or fluid.  There are a couple
subcentimeter ileocolic lymph nodes, nonspecific.

There is scattered atherosclerotic calcification of the aorta and
its branches. No aneurysmal dilatation.

Thin-walled bladder.  Absent uterus.  No adnexal mass.

Osteopenia and multilevel degenerative change.  No acute osseous
finding.
IMPRESSION: No acute abdominopelvic process identified by CT.

Small to moderate hiatal hernia.

Bilateral renal cysts of varying complexity.

3 mm right lower lobe nodule is nonspecific. Recommend comparison
with outside imaging (none available in our system) and attention
on follow-up.

## 2014-01-28 ENCOUNTER — Other Ambulatory Visit: Payer: Self-pay | Admitting: Family Medicine

## 2014-01-28 DIAGNOSIS — E876 Hypokalemia: Secondary | ICD-10-CM

## 2014-01-29 NOTE — Telephone Encounter (Signed)
Dr Lorelei Pont, pt is asking for 90 day supply, but it looks like you wanted her to RTC for repeat K+ test. Pt did see cardiologist and per his OV notes he felt some of pt's cardiac problems most likely r/t hypokalemia. Do you want pt to f/up here for test, or have the cardiologist manage her potassium? Can I put in order for CMP or BMP future order if you want her to have it here?

## 2014-01-31 NOTE — Telephone Encounter (Signed)
Refilled K and ordered BMP.  Called but was not able to reach anyone.  Will send a letter- she does need to come in for a repeat BMP

## 2014-03-11 ENCOUNTER — Ambulatory Visit: Payer: Medicare Other | Admitting: Physician Assistant

## 2014-03-18 ENCOUNTER — Encounter: Payer: Self-pay | Admitting: Physician Assistant

## 2014-03-18 ENCOUNTER — Ambulatory Visit (INDEPENDENT_AMBULATORY_CARE_PROVIDER_SITE_OTHER): Payer: 59 | Admitting: Physician Assistant

## 2014-03-18 VITALS — BP 150/75 | HR 71 | Ht 60.0 in | Wt 132.0 lb

## 2014-03-18 DIAGNOSIS — K219 Gastro-esophageal reflux disease without esophagitis: Secondary | ICD-10-CM

## 2014-03-18 DIAGNOSIS — N189 Chronic kidney disease, unspecified: Secondary | ICD-10-CM

## 2014-03-18 DIAGNOSIS — I471 Supraventricular tachycardia, unspecified: Secondary | ICD-10-CM

## 2014-03-18 DIAGNOSIS — I493 Ventricular premature depolarization: Secondary | ICD-10-CM

## 2014-03-18 DIAGNOSIS — I498 Other specified cardiac arrhythmias: Secondary | ICD-10-CM

## 2014-03-18 DIAGNOSIS — I10 Essential (primary) hypertension: Secondary | ICD-10-CM

## 2014-03-18 DIAGNOSIS — I4949 Other premature depolarization: Secondary | ICD-10-CM

## 2014-03-18 MED ORDER — PANTOPRAZOLE SODIUM 40 MG PO TBEC: 40.0000 mg | DELAYED_RELEASE_TABLET | Freq: Every day | ORAL | Status: DC

## 2014-03-18 NOTE — Patient Instructions (Signed)
Your physician has recommended you make the following change in your medication:  1. STOP ZANTAC 2. START PROTONIX 40 MG DAILY  FOLLOW UP WITH PRIMARY CARE IF YOUR SYMPTOMS ARE NT GETTING ANY BETTER AFTER STARTING THE [place]  Your physician wants you to follow-up in: [place] DR. Burt Knack. You will receive a reminder letter in the mail two months in advance. If you don't receive a letter, please call our office to schedule the follow-up appointment.

## 2014-03-18 NOTE — Progress Notes (Signed)
Cardiology Office Note    Date:  03/18/2014   ID:  Kylie Allen, DOB [birth year], MRN [id number]  PCP:  [name], MD  Cardiologist:  Dr. Sherren Mocha      History of Present Illness: Kylie Allen is a 80 y.o. female with a history of HTN, HL, chronic kidney disease. She was evaluated by Dr. Burt Knack in 2012 for bradycardia and fatigue.  Patient underwent Holter monitor which demonstrated sinus rhythm and no sustained arrhythmias.  She was seen by primary care in July. She was noted to have frequent PVCs. Holter monitor demonstrated sinus rhythm with frequent PVCs and a few runs of paroxysmal supraventricular tachycardia. Dr. Burt Knack started her on metoprolol tartrate 12.5 mg twice a day.  I saw her 7/22.  FU echo demonstrated normal LVF.  She returns for FU.  She is here by herself.  She lives at [facility].  She denies chest pain.  She is short of breath with moderate activities.  She denies syncope.  She denies orthopnea, PND, edema.  She denies significant palpitations since last seen.  She does complain of indigestion mainly at night when she lays down.  She still takes Ranitidine bid.     Echocardiogram (01/2014):  EF 55-60%, no RWMA, Gr 1 DD, mild AI, mod TR, PASP 54 mmHg   Recent Labs: 09/09/2013: ALT 14  12/17/2013: Creatinine 1.39*; Hemoglobin 12.9; Potassium 3.2*   Wt Readings from Last 3 Encounters:  03/18/14 132 lb (59.875 kg)  01/07/14 133 lb (60.328 kg)  12/17/13 127 lb (57.607 kg)     Past Medical History  Diagnosis Date  . Breast cancer     hx of  . Depression with anxiety   . Diverticulosis     hx of  . GERD (gastroesophageal reflux disease)   . Hyperlipidemia   . Hypertension   . CKD (chronic kidney disease)   . H/O: hysterectomy   . Bradycardia   . Fatigue   . History of echocardiogram     Echo (8/15): EF 55-60%, normal wall motion, grade 1 diastolic dysfunction, mild AI, moderate TR, PASP 54 mm Hg    Current Outpatient Prescriptions    Medication Sig Dispense Refill  . amLODipine (NORVASC) 5 MG tablet Take 1 tablet (5 mg total) by mouth daily.  90 tablet  3  . aspirin 325 MG tablet Take 325 mg by mouth as needed.       . gabapentin (NEURONTIN) 300 MG capsule Take 300 mg by mouth 2 (two) times daily. Take one twice a day or as directed. For post- shingles pain      . hydrochlorothiazide (HYDRODIURIL) 25 MG tablet Take 1 tablet (25 mg total) by mouth daily.  90 tablet  3  . LORazepam (ATIVAN) 0.5 MG tablet TAKE 1 TABLET BY MOUTH TWICE DAILY AS NEEDED FOR ANXIETY  60 tablet  2  . metoprolol tartrate (LOPRESSOR) 25 MG tablet Take 0.5 tablets (12.5 mg total) by mouth 2 (two) times daily.  30 tablet  6  . Multiple Vitamin (MULTIVITAMIN WITH MINERALS) TABS tablet Take 1 tablet by mouth daily.      . potassium chloride (K-DUR,KLOR-CON) 10 MEQ tablet TAKE 1 TABLET BY MOUTH EVERY DAY  90 tablet  1  . ranitidine (ZANTAC) 150 MG tablet Take once or twice a day as needed for stomach acid  180 tablet  3  . rosuvastatin (CRESTOR) 20 MG tablet Take 1 tablet (20 mg total) by mouth every evening.  90 tablet  3  No current facility-administered medications for this visit.    Allergies:   Review of patient's allergies indicates no known allergies.   Social History:  The patient  reports that she has quit smoking. She does not have any smokeless tobacco history on file. She reports that she does not drink alcohol.   Family History:  The patient's family history includes Coronary artery disease in her mother; Hypertension in her mother; Stroke in her sister. There is no history of Heart attack.   ROS:  Please see the history of present illness.  No melena or hematochezia.    All other systems reviewed and negative.   PHYSICAL EXAM: VS:  BP 150/75  Pulse 71  Ht 5' (1.524 m)  Wt 132 lb (59.875 kg)  BMI 25.78 kg/m2 Well nourished, well developed, in no acute distress HEENT: normal Neck: no JVD Cardiac:  normal S1, S2; RRR; no  murmur Lungs:  clear to auscultation bilaterally, no wheezing, rhonchi or rales Abd: soft, nontender, no hepatomegaly Ext: no edema Skin: warm and dry Neuro:  CNs 2-12 intact, no focal abnormalities noted  EKG:  NSR, HR 71, LAD, NSSTTW changes.      ASSESSMENT AND PLAN:  1.  PVC's (premature ventricular contractions):  Controlled on current regimen.  No further testing or changes at this time. 2.  SVT (supraventricular tachycardia):  No further symptoms on beta blocker Rx.  Continue current Rx. 3.  HYPERTENSION:  Elevated BP today.  Continue to monitor.  4.  CHRONIC KIDNEY DISEASE UNSPECIFIED:  Follow up with nephrology. 5.  HYPERLIPIDEMIA:  Continue statin 6.  GERD:  DC Zantac.  Start Protonix 40 QD.  FU with primary care.   Disposition:  FU with Dr. Sherren Mocha  in 6 mos.  Signed, Versie Starks, MHS 03/18/2014 3:33 PM    [facility] [place], [place], [place]  [address] Phone: [phone]; Fax: [phone]

## 2014-03-20 ENCOUNTER — Other Ambulatory Visit: Payer: Self-pay | Admitting: Family Medicine

## 2014-03-20 DIAGNOSIS — K219 Gastro-esophageal reflux disease without esophagitis: Secondary | ICD-10-CM

## 2014-03-20 DIAGNOSIS — F411 Generalized anxiety disorder: Secondary | ICD-10-CM

## 2014-03-23 NOTE — Telephone Encounter (Signed)
Dr Lorelei Pont, pt had med refill OV w/you in July, but don't see zantac discussed. Do you want to give RFs? Please review Ativan Rx also.

## 2014-03-25 ENCOUNTER — Ambulatory Visit (INDEPENDENT_AMBULATORY_CARE_PROVIDER_SITE_OTHER): Payer: Medicare Other | Admitting: Family Medicine

## 2014-03-25 VITALS — BP 118/72 | HR 70 | Temp 97.5°F | Resp 16 | Ht 60.0 in | Wt 130.0 lb

## 2014-03-25 DIAGNOSIS — Z23 Encounter for immunization: Secondary | ICD-10-CM

## 2014-03-25 DIAGNOSIS — E876 Hypokalemia: Secondary | ICD-10-CM

## 2014-03-25 NOTE — Progress Notes (Signed)
[facility] [address], [place] [address] [phone]  Date:  03/25/2014   Name:  Kylie Allen   DOB:  07-Jul-1923   MRN:  [id number]  PCP:  [name], MD    Chief Complaint: Follow-up and Medication Refill   History of Present Illness:  Kylie Allen is a 80 y.o. very pleasant female patient who presents with the following:  Here today to follow-up.  She is requesting a RF of lorazepam; I did this refill for her 2 days ago and the pharmmacy confirmed it is available for pick-up.  They saw Richardson Dopp PA-C recently for frequent PVCs. She thinks she might be interested in assisted living facilities as living on her own is getting harder She had a flu shot already this year.   She has not yet had a repeat K level and would like to do this today, also needs to get her prevnar vaccine.   Patient Active Problem List   Diagnosis Date Noted  . Bradycardia 04/19/2011  . HYPERLIPIDEMIA 05/10/2009  . DEPRESSION 05/10/2009  . MIGRAINE HEADACHE 05/10/2009  . HYPERTENSION 05/10/2009  . GERD 05/10/2009  . CHRONIC KIDNEY DISEASE UNSPECIFIED 05/10/2009  . BREAST CANCER, HX OF 05/10/2009  . SYNCOPE, HX OF 05/10/2009  . DIVERTICULITIS, HX OF 05/10/2009  . UTI'S, HX OF 05/10/2009  . CHICKENPOX, HX OF 05/10/2009    Past Medical History  Diagnosis Date  . Breast cancer     hx of  . Depression with anxiety   . Diverticulosis     hx of  . GERD (gastroesophageal reflux disease)   . Hyperlipidemia   . Hypertension   . CKD (chronic kidney disease)   . H/O: hysterectomy   . Bradycardia   . Fatigue   . History of echocardiogram     Echo (8/15): EF 55-60%, normal wall motion, grade 1 diastolic dysfunction, mild AI, moderate TR, PASP 54 mm Hg    Past Surgical History  Procedure Laterality Date  . Mastectomy Right     History  Substance Use Topics  . Smoking status: Former Research scientist (life sciences)  . Smokeless tobacco: Not on file  . Alcohol Use: No    Family  History  Problem Relation Age of Onset  . Coronary artery disease Mother     family hx of  . Hypertension Mother     hx of  . Heart attack Neg Hx   . Stroke Sister     No Known Allergies  Medication list has been reviewed and updated.  Current Outpatient Prescriptions on File Prior to Visit  Medication Sig Dispense Refill  . amLODipine (NORVASC) 5 MG tablet Take 1 tablet (5 mg total) by mouth daily.  90 tablet  3  . aspirin 325 MG tablet Take 325 mg by mouth as needed.       . gabapentin (NEURONTIN) 300 MG capsule Take 300 mg by mouth 2 (two) times daily. Take one twice a day or as directed. For post- shingles pain      . hydrochlorothiazide (HYDRODIURIL) 25 MG tablet Take 1 tablet (25 mg total) by mouth daily.  90 tablet  3  . LORazepam (ATIVAN) 0.5 MG tablet TAKE 1 TABLET BY MOUTH TWICE DAILY AS NEEDED FOR ANXIETY  60 tablet  0  . metoprolol tartrate (LOPRESSOR) 25 MG tablet Take 0.5 tablets (12.5 mg total) by mouth 2 (two) times daily.  30 tablet  6  . Multiple Vitamin (MULTIVITAMIN WITH MINERALS) TABS tablet Take 1  tablet by mouth daily.      . pantoprazole (PROTONIX) 40 MG tablet Take 1 tablet (40 mg total) by mouth daily.  30 tablet  3  . potassium chloride (K-DUR,KLOR-CON) 10 MEQ tablet TAKE 1 TABLET BY MOUTH EVERY DAY  90 tablet  1  . rosuvastatin (CRESTOR) 20 MG tablet Take 1 tablet (20 mg total) by mouth every evening.  90 tablet  3   No current facility-administered medications on file prior to visit.    Review of Systems:  As per HPI- otherwise negative.   Physical Examination: Filed Vitals:   03/25/14 1632  BP: 118/72  Pulse: 70  Temp: 97.5 F (36.4 C)  Resp: 16   Filed Vitals:   03/25/14 1632  Height: 5' (1.524 m)  Weight: 130 lb (58.968 kg)   Body mass index is 25.39 kg/(m^2). Ideal Body Weight: Weight in (lb) to have BMI = 25: 127.7  GEN: WDWN, NAD, Non-toxic, A & O x 3, looks well, her usual self HEENT: Atraumatic, Normocephalic. Neck supple. No  masses, No LAD. Ears and Nose: No external deformity. CV: RRR, No M/G/R. No JVD. No thrill. No extra heart sounds. PULM: CTA B, no wheezes, crackles, rhonchi. No retractions. No resp. distress. No accessory muscle use. EXTR: No c/c/e NEURO Normal gait.  PSYCH: Normally interactive. Conversant. Not depressed or anxious appearing.  Calm demeanor.  Some dementia and forgetfulness    Assessment and Plan: Hypokalemia - Plan: Basic metabolic panel  Immunization due - Plan: Pneumococcal conjugate vaccine 13-valent IM  Check BMP as she had low K at last check, prevnar today See patient instructions for more details.     Signed [name], MD

## 2014-03-25 NOTE — Patient Instructions (Signed)
You got the "prevnar" pneumonia shot today.  You only have to get this shot once.    Contact the "care patrol" team to discuss assisted living [phone] (cell)  Your lorazepam is ready to pick up at the drug store

## 2014-03-26 LAB — BASIC METABOLIC PANEL
BUN: 29 mg/dL — ABNORMAL HIGH (ref 6–23)
CALCIUM: 9.9 mg/dL (ref 8.4–10.5)
CO2: 30 mEq/L (ref 19–32)
Chloride: 101 mEq/L (ref 96–112)
Creat: 1.63 mg/dL — ABNORMAL HIGH (ref 0.50–1.10)
GLUCOSE: 103 mg/dL — AB (ref 70–99)
POTASSIUM: 3.9 meq/L (ref 3.5–5.3)
SODIUM: 143 meq/L (ref 135–145)

## 2014-03-27 ENCOUNTER — Encounter: Payer: Self-pay | Admitting: Family Medicine

## 2014-04-16 ENCOUNTER — Other Ambulatory Visit: Payer: Self-pay | Admitting: Family Medicine

## 2014-04-16 DIAGNOSIS — F411 Generalized anxiety disorder: Secondary | ICD-10-CM

## 2014-05-13 ENCOUNTER — Other Ambulatory Visit: Payer: Self-pay | Admitting: Cardiovascular Disease

## 2014-05-13 ENCOUNTER — Other Ambulatory Visit: Payer: Self-pay | Admitting: Family Medicine

## 2014-06-11 ENCOUNTER — Other Ambulatory Visit: Payer: Self-pay | Admitting: Family Medicine

## 2014-06-11 DIAGNOSIS — F411 Generalized anxiety disorder: Secondary | ICD-10-CM

## 2014-07-23 ENCOUNTER — Ambulatory Visit (INDEPENDENT_AMBULATORY_CARE_PROVIDER_SITE_OTHER): Payer: Medicare Other | Admitting: Family Medicine

## 2014-07-23 VITALS — BP 134/80 | HR 69 | Temp 98.2°F | Resp 17 | Ht 61.0 in | Wt 133.0 lb

## 2014-07-23 DIAGNOSIS — N3000 Acute cystitis without hematuria: Secondary | ICD-10-CM

## 2014-07-23 DIAGNOSIS — F32A Depression, unspecified: Secondary | ICD-10-CM

## 2014-07-23 DIAGNOSIS — F418 Other specified anxiety disorders: Secondary | ICD-10-CM | POA: Diagnosis not present

## 2014-07-23 DIAGNOSIS — F411 Generalized anxiety disorder: Secondary | ICD-10-CM | POA: Diagnosis not present

## 2014-07-23 DIAGNOSIS — Z8639 Personal history of other endocrine, nutritional and metabolic disease: Secondary | ICD-10-CM

## 2014-07-23 DIAGNOSIS — R300 Dysuria: Secondary | ICD-10-CM

## 2014-07-23 DIAGNOSIS — F329 Major depressive disorder, single episode, unspecified: Secondary | ICD-10-CM

## 2014-07-23 DIAGNOSIS — F419 Anxiety disorder, unspecified: Secondary | ICD-10-CM

## 2014-07-23 LAB — POCT URINALYSIS DIPSTICK
Bilirubin, UA: NEGATIVE
GLUCOSE UA: NEGATIVE
NITRITE UA: NEGATIVE
PH UA: 6
Protein, UA: 30
SPEC GRAV UA: 1.02
Urobilinogen, UA: 1

## 2014-07-23 LAB — POCT UA - MICROSCOPIC ONLY
Crystals, Ur, HPF, POC: NEGATIVE
Mucus, UA: NEGATIVE
Yeast, UA: NEGATIVE

## 2014-07-23 LAB — BASIC METABOLIC PANEL
BUN: 23 mg/dL (ref 6–23)
CALCIUM: 9.9 mg/dL (ref 8.4–10.5)
CO2: 26 mEq/L (ref 19–32)
CREATININE: 1.39 mg/dL — AB (ref 0.50–1.10)
Chloride: 100 mEq/L (ref 96–112)
Glucose, Bld: 111 mg/dL — ABNORMAL HIGH (ref 70–99)
Potassium: 2.8 mEq/L — ABNORMAL LOW (ref 3.5–5.3)
SODIUM: 143 meq/L (ref 135–145)

## 2014-07-23 MED ORDER — LORAZEPAM 0.5 MG PO TABS: ORAL_TABLET | ORAL | Status: DC

## 2014-07-23 MED ORDER — CEPHALEXIN 500 MG PO CAPS: 500.0000 mg | ORAL_CAPSULE | Freq: Two times a day (BID) | ORAL | Status: DC

## 2014-07-23 NOTE — Progress Notes (Signed)
Subjective: 81 year old lady who is here for a refill of her lorazepam. She takes 1 pill every morning, his been on it for many years. She says it helps her depression.  She also has a history of hypokalemia which Dr. Lorelei Pont checks from time to time. I think we should follow-up on that also.  Lastly she complained as I was finishing up that she had a urinary tract infection.  Objective: Alert and fairly oriented. Chest clear. Heart regular without murmur.  Assessment: Anxiety and depression for medicine refill Dysuria history of hypokalemia and renal insufficiency   Results for orders placed or performed in visit on 07/23/14  POCT UA - Microscopic Only  Result Value Ref Range   WBC, Ur, HPF, POC 7-12    RBC, urine, microscopic 0-1    Bacteria, U Microscopic 3+    Mucus, UA neg    Epithelial cells, urine per micros 0-1    Crystals, Ur, HPF, POC neg    Casts, Ur, LPF, POC renal tubular    Yeast, UA neg   POCT urinalysis dipstick  Result Value Ref Range   Color, UA yellow    Clarity, UA hazy    Glucose, UA neg    Bilirubin, UA neg    Ketones, UA trace    Spec Grav, UA 1.020    Blood, UA moderate    pH, UA 6.0    Protein, UA 30    Urobilinogen, UA 1.0    Nitrite, UA neg    Leukocytes, UA moderate (2+)

## 2014-07-23 NOTE — Patient Instructions (Signed)
Continue taking the lorazepam (Ativan) but try to take only when needed one half to one pill once or twice daily. It is my advice that you gradually decrease to taking just one half of a pill at a time. These medicines can buildup in the system as one gets older and cause increase problems with coordination and balance. Dr. Edilia Bo is the regular doctor who prescribes these, and urine advised to contact her next time you need a refill.   Take the cephalexin antibiotic one pill twice daily for bladder infectionDrink plenty of water  Return if worse

## 2014-07-27 LAB — URINE CULTURE

## 2014-08-17 ENCOUNTER — Encounter: Payer: Self-pay | Admitting: *Deleted

## 2014-08-19 ENCOUNTER — Other Ambulatory Visit: Payer: Self-pay | Admitting: Family Medicine

## 2014-08-26 ENCOUNTER — Ambulatory Visit (INDEPENDENT_AMBULATORY_CARE_PROVIDER_SITE_OTHER): Payer: Medicare Other | Admitting: Family Medicine

## 2014-08-26 VITALS — BP 140/62 | HR 83 | Temp 97.6°F | Resp 16 | Ht 61.0 in | Wt 133.2 lb

## 2014-08-26 DIAGNOSIS — F419 Anxiety disorder, unspecified: Secondary | ICD-10-CM

## 2014-08-26 DIAGNOSIS — F329 Major depressive disorder, single episode, unspecified: Secondary | ICD-10-CM

## 2014-08-26 DIAGNOSIS — B0229 Other postherpetic nervous system involvement: Secondary | ICD-10-CM

## 2014-08-26 DIAGNOSIS — F0390 Unspecified dementia without behavioral disturbance: Secondary | ICD-10-CM | POA: Diagnosis not present

## 2014-08-26 DIAGNOSIS — E876 Hypokalemia: Secondary | ICD-10-CM | POA: Diagnosis not present

## 2014-08-26 DIAGNOSIS — F418 Other specified anxiety disorders: Secondary | ICD-10-CM

## 2014-08-26 DIAGNOSIS — F411 Generalized anxiety disorder: Secondary | ICD-10-CM | POA: Diagnosis not present

## 2014-08-26 DIAGNOSIS — R300 Dysuria: Secondary | ICD-10-CM

## 2014-08-26 LAB — BASIC METABOLIC PANEL
BUN: 24 mg/dL — ABNORMAL HIGH (ref 6–23)
CHLORIDE: 101 meq/L (ref 96–112)
CO2: 23 mEq/L (ref 19–32)
Calcium: 10 mg/dL (ref 8.4–10.5)
Creat: 1.35 mg/dL — ABNORMAL HIGH (ref 0.50–1.10)
Glucose, Bld: 110 mg/dL — ABNORMAL HIGH (ref 70–99)
Potassium: 4.1 mEq/L (ref 3.5–5.3)
Sodium: 141 mEq/L (ref 135–145)

## 2014-08-26 LAB — POCT URINALYSIS DIPSTICK
BILIRUBIN UA: NEGATIVE
Glucose, UA: NEGATIVE
KETONES UA: NEGATIVE
LEUKOCYTES UA: NEGATIVE
Nitrite, UA: NEGATIVE
PROTEIN UA: NEGATIVE
RBC UA: NEGATIVE
Spec Grav, UA: 1.01
Urobilinogen, UA: 0.2
pH, UA: 5.5

## 2014-08-26 LAB — POCT UA - MICROSCOPIC ONLY
Bacteria, U Microscopic: NEGATIVE
CASTS, UR, LPF, POC: NEGATIVE
CRYSTALS, UR, HPF, POC: NEGATIVE
Epithelial cells, urine per micros: NEGATIVE
MUCUS UA: NEGATIVE
RBC, urine, microscopic: NEGATIVE
WBC, Ur, HPF, POC: NEGATIVE
Yeast, UA: NEGATIVE

## 2014-08-26 MED ORDER — GABAPENTIN 300 MG PO CAPS: 300.0000 mg | ORAL_CAPSULE | Freq: Every day | ORAL | Status: DC

## 2014-08-26 MED ORDER — LORAZEPAM 0.5 MG PO TABS: ORAL_TABLET | ORAL | Status: DC

## 2014-08-26 NOTE — Patient Instructions (Signed)
Your urine currently looks clear, but I will check a culture for you and will call you. I will also call you regarding your blood work You can use the ativan as usual, but remember this will make you feel sleepy.  You should cut back to a 1/2 tablet of ativan as much as possible Also, change to just one gabapentin daily

## 2014-08-26 NOTE — Progress Notes (Addendum)
[facility] [address], [place] [address] [phone]  Date:  08/26/2014   Name:  Kylie Allen   DOB:  [birth year]   MRN:  [id number]  PCP:  [name], MD    Chief Complaint: Urinary Tract Infection   History of Present Illness:  Kylie Allen is a 81 y.o. very pleasant female patient who presents with the following:  Elderly lady who I last saw in October.  She was last here a month ago with complaint of a UTI.  She was treated with keflex and her ativan was refilled by my partner Dr. Linna Darner.  Her urine culture was positive for an e coli UTI- however it appears that her bacteria was resistant to cefazolin. Her UTI sx seem to have returned over the last few days.  Her sx are not as bad as they were, she is not able to describe her sx really but states there is no blood in her urine.   Her K was also low; she was called and asked to replete her K at home, given instructions as to how to do this.  She states that she has been taking her K replacement  She is here today stating that she needs more lorazepam as she is "out of it."  I have tried for several years to get her to stop taking this medication, but she does not feel that she is able to do so.   I called and discussed with Kylie Allen, her daughter who is her closest caretaker.  Expressed my concern about her living along and handling her medications as her memory does continue to get worse.  Kylie Allen is aware and is by her house several times a week.  So far she has refused assisted living  Patient Active Problem List   Diagnosis Date Noted  . Bradycardia 04/19/2011  . HYPERLIPIDEMIA 05/10/2009  . DEPRESSION 05/10/2009  . MIGRAINE HEADACHE 05/10/2009  . HYPERTENSION 05/10/2009  . GERD 05/10/2009  . CHRONIC KIDNEY DISEASE UNSPECIFIED 05/10/2009  . BREAST CANCER, HX OF 05/10/2009  . SYNCOPE, HX OF 05/10/2009  . DIVERTICULITIS, HX OF 05/10/2009  . UTI'S, HX OF 05/10/2009  . CHICKENPOX, HX OF  05/10/2009    Past Medical History  Diagnosis Date  . Breast cancer     hx of  . Depression with anxiety   . Diverticulosis     hx of  . GERD (gastroesophageal reflux disease)   . Hyperlipidemia   . Hypertension   . CKD (chronic kidney disease)   . H/O: hysterectomy   . Bradycardia   . Fatigue   . History of echocardiogram     Echo (8/15): EF 55-60%, normal wall motion, grade 1 diastolic dysfunction, mild AI, moderate TR, PASP 54 mm Hg    Past Surgical History  Procedure Laterality Date  . Mastectomy Right     History  Substance Use Topics  . Smoking status: Former Research scientist (life sciences)  . Smokeless tobacco: Not on file  . Alcohol Use: No    Family History  Problem Relation Age of Onset  . Coronary artery disease Mother     family hx of  . Hypertension Mother     hx of  . Heart attack Neg Hx   . Stroke Sister     No Known Allergies  Medication list has been reviewed and updated.  Current Outpatient Prescriptions on File Prior to Visit  Medication Sig Dispense Refill  . amLODipine (NORVASC) 5 MG tablet Take 1  tablet (5 mg total) by mouth daily. PATIENT NEEDS BLOOD PRESSURE CHECK UP FOR ADDITIONAL REFILLS 30 tablet 0  . aspirin 325 MG tablet Take 325 mg by mouth as needed.     . gabapentin (NEURONTIN) 300 MG capsule Take 300 mg by mouth 2 (two) times daily. Take one twice a day or as directed. For post- shingles pain    . hydrochlorothiazide (HYDRODIURIL) 25 MG tablet Take 1 tablet (25 mg total) by mouth daily. PATIENT NEEDS BLOOD PRESSURE CHECK UP FOR ADDITIONAL REFILLS 30 tablet 0  . LORazepam (ATIVAN) 0.5 MG tablet Take one half to one pill once or twice daily if needed for anxiety 60 tablet 0  . metoprolol tartrate (LOPRESSOR) 25 MG tablet TAKE 1/2 TABLET(12.5MG ) BY MOUTH TWICE DAILY 30 tablet 6  . Multiple Vitamin (MULTIVITAMIN WITH MINERALS) TABS tablet Take 1 tablet by mouth daily.    . pantoprazole (PROTONIX) 40 MG tablet Take 1 tablet (40 mg total) by mouth daily. 30  tablet 3  . potassium chloride (K-DUR,KLOR-CON) 10 MEQ tablet TAKE 1 TABLET BY MOUTH EVERY DAY 90 tablet 1  . ranitidine (ZANTAC) 150 MG capsule Take 150 mg by mouth 2 (two) times daily.    . rosuvastatin (CRESTOR) 20 MG tablet Take 1 tablet (20 mg total) by mouth every evening. 90 tablet 3  . cephALEXin (KEFLEX) 500 MG capsule Take 1 capsule (500 mg total) by mouth 2 (two) times daily. (Patient not taking: Reported on 08/26/2014) 14 capsule 0   No current facility-administered medications on file prior to visit.    Review of Systems:  As per HPI- otherwise negative. Creat clearance is 26  Physical Examination: Filed Vitals:   08/26/14 1221  BP: 140/62  Pulse: 83  Temp: 97.6 F (36.4 C)  Resp: 16   Filed Vitals:   08/26/14 1221  Height: 5\' 1"  (1.549 m)  Weight: 133 lb 3.2 oz (60.419 kg)   Body mass index is 25.18 kg/(m^2). Ideal Body Weight: Weight in (lb) to have BMI = 25: 132  GEN: WDWN, NAD, Non-toxic, A & O x 3, elderly lady who is nearly groomed and dressed. Appears well HEENT: Atraumatic, Normocephalic. Neck supple. No masses, No LAD. Ears and Nose: No external deformity. CV: RRR, No M/G/R. No JVD. No thrill. No extra heart sounds. PULM: CTA B, no wheezes, crackles, rhonchi. No retractions. No resp. distress. No accessory muscle use. EXTR: No c/c/e NEURO Normal gait.  PSYCH: Normally interactive. Conversant. Not depressed or anxious appearing.  Calm demeanor.   Results for orders placed or performed in visit on 08/26/14  POCT UA - Microscopic Only  Result Value Ref Range   WBC, Ur, HPF, POC neg    RBC, urine, microscopic neg    Bacteria, U Microscopic neg    Mucus, UA neg    Epithelial cells, urine per micros neg    Crystals, Ur, HPF, POC neg    Casts, Ur, LPF, POC neg    Yeast, UA neg   POCT urinalysis dipstick  Result Value Ref Range   Color, UA yellow    Clarity, UA clear    Glucose, UA neg    Bilirubin, UA neg    Ketones, UA neg    Spec Grav, UA 1.010     Blood, UA neg    pH, UA 5.5    Protein, UA neg    Urobilinogen, UA 0.2    Nitrite, UA neg    Leukocytes, UA Negative    Assessment and  Plan: GAD (generalized anxiety disorder) - Plan: LORazepam (ATIVAN) 0.5 MG tablet  Anxiety and depression - Plan: LORazepam (ATIVAN) 0.5 MG tablet  Dysuria - Plan: POCT UA - Microscopic Only, POCT urinalysis dipstick, Urine culture  Hypokalemia - Plan: Basic metabolic panel  Post herpetic neuralgia - Plan: gabapentin (NEURONTIN) 300 MG capsule  Dementia, without behavioral disturbance  Reassured that her urine looks good today- will await her culture before repeating abx Refilled her ativan.  Encouraged her to cut down on use Refilled her gabapentin- reduced to one a day Check her BMP  Signed [name], MD  3/15:called, let her know that her urine culture is essentially negative, she states that right now her sx are resolved.  Asked her to let me know if her symptoms come back  Results for orders placed or performed in visit on 08/26/14  Urine culture  Result Value Ref Range   Colony Count 25,000 COLONIES/ML    Organism ID, Bacteria Multiple bacterial morphotypes present, none    Organism ID, Bacteria predominant. Suggest appropriate recollection if     Organism ID, Bacteria clinically indicated.   Basic metabolic panel  Result Value Ref Range   Sodium 141 135 - 145 mEq/L   Potassium 4.1 3.5 - 5.3 mEq/L   Chloride 101 96 - 112 mEq/L   CO2 23 19 - 32 mEq/L   Glucose, Bld 110 (H) 70 - 99 mg/dL   BUN 24 (H) 6 - 23 mg/dL   Creat 1.35 (H) 0.50 - 1.10 mg/dL   Calcium 10.0 8.4 - 10.5 mg/dL  POCT UA - Microscopic Only  Result Value Ref Range   WBC, Ur, HPF, POC neg    RBC, urine, microscopic neg    Bacteria, U Microscopic neg    Mucus, UA neg    Epithelial cells, urine per micros neg    Crystals, Ur, HPF, POC neg    Casts, Ur, LPF, POC neg    Yeast, UA neg   POCT urinalysis dipstick  Result Value Ref Range   Color, UA  yellow    Clarity, UA clear    Glucose, UA neg    Bilirubin, UA neg    Ketones, UA neg    Spec Grav, UA 1.010    Blood, UA neg    pH, UA 5.5    Protein, UA neg    Urobilinogen, UA 0.2    Nitrite, UA neg    Leukocytes, UA Negative

## 2014-08-29 LAB — URINE CULTURE: Colony Count: 25000

## 2014-09-01 ENCOUNTER — Encounter: Payer: Self-pay | Admitting: Family Medicine

## 2014-09-06 ENCOUNTER — Other Ambulatory Visit: Payer: Self-pay | Admitting: Family Medicine

## 2014-09-21 ENCOUNTER — Other Ambulatory Visit: Payer: Self-pay | Admitting: Family Medicine

## 2014-09-22 NOTE — Telephone Encounter (Signed)
Mild renal insuff stable for the last 6 years.  Ok to continue HCTZ.  Another medication might be more ideal but given her general confusion would rather not make a change

## 2014-09-22 NOTE — Telephone Encounter (Signed)
Dr Lorelei Pont, you just saw pt for check up, but don't see HTN addressed. OK to RF?

## 2014-11-11 ENCOUNTER — Ambulatory Visit (INDEPENDENT_AMBULATORY_CARE_PROVIDER_SITE_OTHER): Payer: Medicare Other | Admitting: Family Medicine

## 2014-11-11 ENCOUNTER — Other Ambulatory Visit: Payer: Self-pay | Admitting: Family Medicine

## 2014-11-11 VITALS — BP 150/80 | HR 97 | Temp 98.6°F | Resp 16 | Ht 59.5 in | Wt 130.0 lb

## 2014-11-11 DIAGNOSIS — F419 Anxiety disorder, unspecified: Secondary | ICD-10-CM

## 2014-11-11 DIAGNOSIS — F411 Generalized anxiety disorder: Secondary | ICD-10-CM | POA: Diagnosis not present

## 2014-11-11 DIAGNOSIS — F418 Other specified anxiety disorders: Secondary | ICD-10-CM

## 2014-11-11 DIAGNOSIS — F329 Major depressive disorder, single episode, unspecified: Secondary | ICD-10-CM

## 2014-11-11 MED ORDER — LORAZEPAM 0.5 MG PO TABS: ORAL_TABLET | ORAL | Status: DC

## 2014-11-11 NOTE — Patient Instructions (Signed)
Good to see you today. Continue to take your lorazepam just once a day as much as you can Give your heart doctor a call and schedule a follow-up visit:  [facility] [address], [place], [place] [address] Phone: [phone]; Fax: [phone]

## 2014-11-11 NOTE — Progress Notes (Signed)
[facility] [address], [place] [address] [phone]  Date:  11/11/2014   Name:  Kylie Allen   DOB:  [birth year]   MRN:  [id number]  PCP:  [name], MD    Chief Complaint: Medication Refill   History of Present Illness:  Kylie Allen is a 81 y.o. very pleasant female patient who presents with the following:  Here today seeking a refill of her lorazepam.  I have encouraged her to cut down or stop her use of this medication for years but so far she has not felt that she can do without it.   She is otherwise ok today.  She states that she is taking her ativan usually once a day, sometimes twice a day.  "usually one will last me practically all day."  She will try to stick with once a day as much as she can.  She feels that her depression is under control currently  She is not sure when she is supposed to follow-up with her heart doctor for her bradycardia.  She worries that she needs to make another appt  Last rx #60 with 1 RF on March 9  She thinks that she is supposed to get a refill of one of her heart medications soon- however she is not really sure what she needs.  She will look at her bottles at home and let me know   BP Readings from Last 3 Encounters:  11/11/14 162/68  08/26/14 140/62  07/23/14 134/80    Pulse Readings from Last 3 Encounters:  11/11/14 97  08/26/14 83  07/23/14 69     Patient Active Problem List   Diagnosis Date Noted  . Dementia 08/26/2014  . Bradycardia 04/19/2011  . HYPERLIPIDEMIA 05/10/2009  . DEPRESSION 05/10/2009  . MIGRAINE HEADACHE 05/10/2009  . HYPERTENSION 05/10/2009  . GERD 05/10/2009  . CHRONIC KIDNEY DISEASE UNSPECIFIED 05/10/2009  . BREAST CANCER, HX OF 05/10/2009  . SYNCOPE, HX OF 05/10/2009  . DIVERTICULITIS, HX OF 05/10/2009  . UTI'S, HX OF 05/10/2009  . CHICKENPOX, HX OF 05/10/2009    Past Medical History  Diagnosis Date  . Breast cancer     hx of  . Depression with anxiety   .  Diverticulosis     hx of  . GERD (gastroesophageal reflux disease)   . Hyperlipidemia   . Hypertension   . CKD (chronic kidney disease)   . H/O: hysterectomy   . Bradycardia   . Fatigue   . History of echocardiogram     Echo (8/15): EF 55-60%, normal wall motion, grade 1 diastolic dysfunction, mild AI, moderate TR, PASP 54 mm Hg    Past Surgical History  Procedure Laterality Date  . Mastectomy Right     History  Substance Use Topics  . Smoking status: Former Research scientist (life sciences)  . Smokeless tobacco: Not on file  . Alcohol Use: No    Family History  Problem Relation Age of Onset  . Coronary artery disease Mother     family hx of  . Hypertension Mother     hx of  . Heart attack Neg Hx   . Stroke Sister     No Known Allergies  Medication list has been reviewed and updated.  Current Outpatient Prescriptions on File Prior to Visit  Medication Sig Dispense Refill  . amLODipine (NORVASC) 5 MG tablet Take 1 tablet (5 mg total) by mouth daily. 30 tablet 5  . aspirin 325 MG tablet Take 325 mg by  mouth as needed.     . gabapentin (NEURONTIN) 300 MG capsule Take 1 capsule (300 mg total) by mouth daily. For post- shingles pain 60 capsule 3  . hydrochlorothiazide (HYDRODIURIL) 25 MG tablet TAKE 1 TABLET BY MOUTH DAILY 30 tablet 5  . LORazepam (ATIVAN) 0.5 MG tablet Take one half to one pill once or twice daily if needed for anxiety 60 tablet 1  . metoprolol tartrate (LOPRESSOR) 25 MG tablet TAKE 1/2 TABLET(12.5MG ) BY MOUTH TWICE DAILY 30 tablet 6  . Multiple Vitamin (MULTIVITAMIN WITH MINERALS) TABS tablet Take 1 tablet by mouth daily.    . pantoprazole (PROTONIX) 40 MG tablet Take 1 tablet (40 mg total) by mouth daily. 30 tablet 3  . potassium chloride (K-DUR,KLOR-CON) 10 MEQ tablet TAKE ONE(1) TABLET BY MOUTH EVERY DAY 90 tablet 3  . ranitidine (ZANTAC) 150 MG capsule Take 150 mg by mouth 2 (two) times daily.    . rosuvastatin (CRESTOR) 20 MG tablet Take 1 tablet (20 mg total) by mouth  every evening. 90 tablet 3   No current facility-administered medications on file prior to visit.    Review of Systems:  As per HPI- otherwise negative.   Physical Examination: Filed Vitals:   11/11/14 1554  BP: 162/68  Pulse: 97  Temp: 98.6 F (37 C)  Resp: 16   Filed Vitals:   11/11/14 1554  Height: 4' 11.5" (1.511 m)  Weight: 130 lb (58.968 kg)   Body mass index is 25.83 kg/(m^2). Ideal Body Weight: Weight in (lb) to have BMI = 25: 125.6  GEN: WDWN, NAD, Non-toxic, A & O x 3, looks well although frail, neatly dressed and groomed HEENT: Atraumatic, Normocephalic. Neck supple. No masses, No LAD. Ears and Nose: No external deformity. CV: RRR, No M/G/R. No JVD. No thrill. No extra heart sounds. PULM: CTA B, no wheezes, crackles, rhonchi. No retractions. No resp. distress. No accessory muscle use. EXTR: No c/c/e NEURO Normal gait.  PSYCH: Normally interactive. Conversant. Not depressed or anxious appearing.  Calm demeanor.    Assessment and Plan: GAD (generalized anxiety disorder) - Plan: LORazepam (ATIVAN) 0.5 MG tablet  Anxiety and depression - Plan: LORazepam (ATIVAN) 0.5 MG tablet  Refilled her lorazepam today.  Encouraged her to take this just once a day as much as she is able She will follow-up with cardiology as needed   Signed [name], MD

## 2015-01-06 ENCOUNTER — Other Ambulatory Visit: Payer: Self-pay

## 2015-01-06 DIAGNOSIS — B0229 Other postherpetic nervous system involvement: Secondary | ICD-10-CM

## 2015-01-06 NOTE — Telephone Encounter (Signed)
Pt needs RF of gabapentin, but not sure of correct dose, QD or BID. First Rxd BID  (march 2016) and don't see notes about it changing, but on list as QD at May OV. Jonelle Sidle is waiting on CB from pt's daughter to verify.

## 2015-01-07 MED ORDER — GABAPENTIN 300 MG PO CAPS: 300.0000 mg | ORAL_CAPSULE | Freq: Every day | ORAL | Status: DC

## 2015-01-10 ENCOUNTER — Other Ambulatory Visit: Payer: Self-pay | Admitting: Family Medicine

## 2015-02-23 ENCOUNTER — Telehealth: Payer: Self-pay

## 2015-02-23 ENCOUNTER — Other Ambulatory Visit: Payer: Self-pay | Admitting: Family Medicine

## 2015-02-23 DIAGNOSIS — B0229 Other postherpetic nervous system involvement: Secondary | ICD-10-CM

## 2015-02-23 MED ORDER — GABAPENTIN 300 MG PO CAPS: 300.0000 mg | ORAL_CAPSULE | Freq: Two times a day (BID) | ORAL | Status: DC

## 2015-02-23 NOTE — Telephone Encounter (Signed)
Dr Lorelei Pont, pt's granddaughter Jonelle Sidle) stated that pt is supposed to be taking 2 tabs daily, but Rx is only for 1 QD. Pt has been running out early bc of the dose increase. Did you want pt on 1 tab BID, 2 tabs QD? Or just 1QD?Marland Kitchen They would like a new Rx sent in for the corrected dose so pt won't run out. I had a question about this at 01/09/15 refill enc, but RF was sent w/sig 1QD and #60. Not sure which is correct.

## 2015-02-23 NOTE — Telephone Encounter (Signed)
Her creat clearance is 25, so she may take it twice a day.  Will correct this for her Meds ordered this encounter  Medications  . gabapentin (NEURONTIN) 300 MG capsule    Sig: Take 1 capsule (300 mg total) by mouth 2 (two) times daily. For post- shingles pain    Dispense:  60 capsule    Refill:  6   Sent message to [employer]

## 2015-03-23 ENCOUNTER — Other Ambulatory Visit: Payer: Self-pay | Admitting: Family Medicine

## 2015-03-23 DIAGNOSIS — F411 Generalized anxiety disorder: Secondary | ICD-10-CM

## 2015-03-30 ENCOUNTER — Other Ambulatory Visit: Payer: Self-pay | Admitting: Family Medicine

## 2015-03-31 ENCOUNTER — Other Ambulatory Visit: Payer: Self-pay | Admitting: Physician Assistant

## 2015-04-27 ENCOUNTER — Ambulatory Visit (INDEPENDENT_AMBULATORY_CARE_PROVIDER_SITE_OTHER): Payer: Medicare Other | Admitting: Family Medicine

## 2015-04-27 VITALS — BP 128/60 | HR 90 | Temp 97.9°F | Resp 16 | Ht 59.5 in | Wt 134.1 lb

## 2015-04-27 DIAGNOSIS — I10 Essential (primary) hypertension: Secondary | ICD-10-CM | POA: Diagnosis not present

## 2015-04-27 DIAGNOSIS — N184 Chronic kidney disease, stage 4 (severe): Secondary | ICD-10-CM | POA: Diagnosis not present

## 2015-04-27 DIAGNOSIS — F411 Generalized anxiety disorder: Secondary | ICD-10-CM

## 2015-04-27 DIAGNOSIS — R001 Bradycardia, unspecified: Secondary | ICD-10-CM

## 2015-04-27 DIAGNOSIS — B0229 Other postherpetic nervous system involvement: Secondary | ICD-10-CM

## 2015-04-27 DIAGNOSIS — E785 Hyperlipidemia, unspecified: Secondary | ICD-10-CM | POA: Diagnosis not present

## 2015-04-27 DIAGNOSIS — Z79899 Other long term (current) drug therapy: Secondary | ICD-10-CM

## 2015-04-27 LAB — COMPREHENSIVE METABOLIC PANEL
ALBUMIN: 4.1 g/dL (ref 3.6–5.1)
ALT: 7 U/L (ref 6–29)
AST: 16 U/L (ref 10–35)
Alkaline Phosphatase: 70 U/L (ref 33–130)
BUN: 30 mg/dL — ABNORMAL HIGH (ref 7–25)
CALCIUM: 9.8 mg/dL (ref 8.6–10.4)
CO2: 23 mmol/L (ref 20–31)
Chloride: 104 mmol/L (ref 98–110)
Creat: 1.44 mg/dL — ABNORMAL HIGH (ref 0.60–0.88)
GLUCOSE: 101 mg/dL — AB (ref 65–99)
POTASSIUM: 3.2 mmol/L — AB (ref 3.5–5.3)
Sodium: 142 mmol/L (ref 135–146)
Total Bilirubin: 0.3 mg/dL (ref 0.2–1.2)
Total Protein: 7.4 g/dL (ref 6.1–8.1)

## 2015-04-27 MED ORDER — PANTOPRAZOLE SODIUM 40 MG PO TBEC: DELAYED_RELEASE_TABLET | ORAL | Status: DC

## 2015-04-27 MED ORDER — GABAPENTIN 100 MG PO CAPS: ORAL_CAPSULE | ORAL | Status: DC

## 2015-04-27 MED ORDER — AMLODIPINE BESYLATE 5 MG PO TABS: 5.0000 mg | ORAL_TABLET | Freq: Every day | ORAL | Status: DC

## 2015-04-27 MED ORDER — HYDROCHLOROTHIAZIDE 25 MG PO TABS: 25.0000 mg | ORAL_TABLET | Freq: Every day | ORAL | Status: DC

## 2015-04-27 MED ORDER — LORAZEPAM 0.5 MG PO TABS: ORAL_TABLET | ORAL | Status: DC

## 2015-04-27 NOTE — Progress Notes (Signed)
Subjective:  This chart was scribed for [name], MD by Leandra Kern, Medical Scribe. This patient was seen in Room 10 and the patient's care was started at 3:44 PM.   Patient ID: Kylie Allen, female    DOB: 05-25-1924, 81 y.o.   MRN: [id number]  Chief Complaint  Patient presents with  . Medication Refill    refills of ativan and gabapentin    HPI HPI Comments: Kylie Allen is a 81 y.o. female who [facility] with her daughter requesting medications refill for ativan and gabapentin. Pt's last cardiology visit was on 05/2014. Pt is seen by Dr. Burt Knack for a hx of bradycardia and fatigue. Cardiology recommended that she follows up with Dr. Burt Knack in 6 months, this appointment was never made. The metoprolol was started around 2015 for PSVT and frequent PVC's. Pt saw [facility] in December. She see Dr. Senaida Lange, her appointment should be due next month. She has a hx of PTH of 120, and takes rocaltrol. Last creatinine clearance 25, was ok to take up to 700 mg of gabapentin a day.   Today pt indicates that the medication are very essential to her life. Pt takes one dose, or two doses at times of the ativan when needed as she experiences increased anxiety. She states that her anxiety presents as changes in mood, difference in the way she speaks, and more agitated in general. Pt does have symptoms of not being able to have a good night sleep. Pt denies tremors when off the medication.   Pt has been on the same dosage of the gabapentin for a very long time. She takes it once or twice per day, depending on the need, however not before bed time. Pt reports satisfaction with the outcomes of the medication. 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 Pt denies any current rashes.   Patient Active Problem List   Diagnosis Date Noted  . Dementia 08/26/2014  . Bradycardia 04/19/2011    . HYPERLIPIDEMIA 05/10/2009  . DEPRESSION 05/10/2009  . MIGRAINE HEADACHE 05/10/2009  . HYPERTENSION 05/10/2009  . GERD 05/10/2009  . CHRONIC KIDNEY DISEASE UNSPECIFIED 05/10/2009  . BREAST CANCER, HX OF 05/10/2009  . SYNCOPE, HX OF 05/10/2009  . DIVERTICULITIS, HX OF 05/10/2009  . UTI'S, HX OF 05/10/2009   Past Medical History  Diagnosis Date  . Breast cancer (HCC)     hx of  . Depression with anxiety   . Diverticulosis     hx of  . GERD (gastroesophageal reflux disease)   . Hyperlipidemia   . Hypertension   . CKD (chronic kidney disease)   . H/O: hysterectomy   . Bradycardia   . Fatigue   . History of echocardiogram     Echo (8/15): EF 55-60%, normal wall motion, grade 1 diastolic dysfunction, mild AI, moderate TR, PASP 54 mm Hg   Past Surgical History  Procedure Laterality Date  . Mastectomy Right    No Known Allergies Prior to Admission medications   Medication Sig Start Date End Date Taking? Authorizing Provider  amLODipine (NORVASC) 5 MG tablet Take 1 tablet (5 mg total) by mouth daily. 09/22/14  Yes Gay Filler Copland, MD  aspirin 325 MG tablet Take 325 mg by mouth as needed.    Yes Historical Provider, MD  gabapentin (NEURONTIN) 300 MG capsule Take 1 capsule (300 mg  total) by mouth 2 (two) times daily. For post- shingles pain 02/23/15  Yes Gay Filler Copland, MD  hydrochlorothiazide (HYDRODIURIL) 25 MG tablet TAKE 1 TABLET BY MOUTH DAILY 09/22/14  Yes Jessica C Copland, MD  LORazepam (ATIVAN) 0.5 MG tablet TAKE ONE-HALF TO 1 TABLET BY MOUTH ONCE OR TWICE DAILY AS NEEDED FOR ANXIETY 03/25/15  Yes Gay Filler Copland, MD  pantoprazole (PROTONIX) 40 MG tablet TAKE 1 TABLET BY MOUTH EVERY DAY 03/31/15  Yes Chelle Jeffery, PA-C  potassium chloride (K-DUR,KLOR-CON) 10 MEQ tablet TAKE ONE(1) TABLET BY MOUTH EVERY DAY 09/08/14  Yes Gay Filler Copland, MD  rosuvastatin (CRESTOR) 20 MG tablet Take 1 tablet (20 mg total) by mouth every evening. 09/09/13  Yes Gay Filler Copland, MD   metoprolol tartrate (LOPRESSOR) 25 MG tablet TAKE 1/2 TABLET(12.5MG ) BY MOUTH TWICE DAILY Patient not taking: Reported on 04/27/2015 05/18/14   Sherren Mocha, MD  Multiple Vitamin (MULTIVITAMIN WITH MINERALS) TABS tablet Take 1 tablet by mouth daily.    Historical Provider, MD  ranitidine (ZANTAC) 150 MG capsule Take 150 mg by mouth 2 (two) times daily.    Historical Provider, MD   Social History   Social History  . Marital Status: Widowed    Spouse Name: N/A  . Number of Children: N/A  . Years of Education: N/A   Occupational History  . Not on file.   Social History Main Topics  . Smoking status: Former Research scientist (life sciences)  . Smokeless tobacco: Not on file  . Alcohol Use: No  . Drug Use: Not on file  . Sexual Activity: Not on file   Other Topics Concern  . Not on file   Social History Narrative    Review of Systems  Constitutional: Positive for fatigue. Negative for fever, activity change and appetite change.  Eyes: Negative for visual disturbance.  Respiratory: Negative for chest tightness.   Cardiovascular: Positive for palpitations. Negative for chest pain.  Musculoskeletal: Positive for myalgias, back pain, joint swelling, arthralgias and gait problem.  Skin: Negative for rash.  Neurological: Positive for weakness. Negative for dizziness, tremors, light-headedness and headaches.  Psychiatric/Behavioral: Positive for confusion, sleep disturbance and agitation. Negative for dysphoric mood. The patient is nervous/anxious.       Objective:   Physical Exam  Constitutional: She is oriented to person, place, and time. She appears well-developed and well-nourished. No distress.  HENT:  Head: Normocephalic and atraumatic.  Eyes: EOM are normal. Pupils are equal, round, and reactive to light.  Neck: Neck supple.  Cardiovascular: Normal rate.   Pulses:      Carotid pulses are 2+ on the right side, and 2+ on the left side. 3/6 systolic murmor left upper sternum border with irregular  rhythm.  No carotid bruits.  Pulmonary/Chest: Effort normal.  Neurological: She is alert and oriented to person, place, and time. No cranial nerve deficit.  Skin: Skin is warm and dry.  Psychiatric: She has a normal mood and affect. Her behavior is normal.  Nursing note and vitals reviewed.   BP 128/60 mmHg  Pulse 90  Temp(Src) 97.9 F (36.6 C) (Oral)  Resp 16  Ht 4' 11.5" (1.511 m)  Wt 134 lb 2 oz (60.839 kg)  BMI 26.65 kg/m2  SpO2 92%     Assessment & Plan:   1. Essential hypertension   2. Chronic kidney disease, stage 4 (severe) (HCC)   3. Bradycardia   4. Hyperlipidemia   5. Polypharmacy   6. Post herpetic neuralgia   7. GAD (generalized  anxiety disorder)     Orders Placed This Encounter  Procedures  . Comprehensive metabolic panel    Meds ordered this encounter  Medications  . gabapentin (NEURONTIN) 100 MG capsule    Sig: Take 1 tab three times a day as needed for post- shingles pain. Take 3 tabs po every night    Dispense:  180 capsule    Refill:  3  . LORazepam (ATIVAN) 0.5 MG tablet    Sig: TAKE ONE-HALF TO 1 TABLET BY MOUTH ONCE OR TWICE DAILY AS NEEDED FOR ANXIETY    Dispense:  60 tablet    Refill:  5  . amLODipine (NORVASC) 5 MG tablet    Sig: Take 1 tablet (5 mg total) by mouth daily.    Dispense:  30 tablet    Refill:  5  . pantoprazole (PROTONIX) 40 MG tablet    Sig: TAKE 1 TABLET BY MOUTH EVERY DAY    Dispense:  30 tablet    Refill:  5  . hydrochlorothiazide (HYDRODIURIL) 25 MG tablet    Sig: Take 1 tablet (25 mg total) by mouth daily.    Dispense:  30 tablet    Refill:  5    I personally performed the services described in this documentation, which was scribed in my presence. The recorded information has been reviewed and considered, and addended by me as needed.  [name], MD MPH    By signing my name below, I, Rawaa Al Rifaie, attest that this documentation has been prepared under the direction and in the presence of [name], MD.    Leandra Kern, Medical Scribe. 04/27/2015.  3:57 PM.

## 2015-05-16 ENCOUNTER — Encounter: Payer: Self-pay | Admitting: Family Medicine

## 2015-05-16 MED ORDER — POTASSIUM CHLORIDE CRYS ER 10 MEQ PO TBCR: EXTENDED_RELEASE_TABLET | ORAL | Status: DC

## 2015-07-09 ENCOUNTER — Encounter: Payer: Self-pay | Admitting: Family Medicine

## 2015-07-14 ENCOUNTER — Encounter: Payer: Self-pay | Admitting: Family Medicine

## 2015-09-02 ENCOUNTER — Other Ambulatory Visit: Payer: Self-pay | Admitting: Family Medicine

## 2015-10-26 ENCOUNTER — Other Ambulatory Visit: Payer: Self-pay | Admitting: Family Medicine

## 2015-10-28 ENCOUNTER — Other Ambulatory Visit: Payer: Self-pay | Admitting: Family Medicine

## 2015-11-02 ENCOUNTER — Other Ambulatory Visit: Payer: Self-pay | Admitting: Family Medicine

## 2015-11-04 ENCOUNTER — Other Ambulatory Visit: Payer: Self-pay | Admitting: Family Medicine

## 2015-11-05 NOTE — Telephone Encounter (Signed)
Spoke with pt and informed her that she needs to come back in for OV for a refill

## 2015-11-05 NOTE — Telephone Encounter (Signed)
Needs OV for additional refills.

## 2015-11-05 NOTE — Telephone Encounter (Signed)
Thanks, but did you also tell her that there was a rx she or a family member can pick up or we can call or fax into pharmacy for her - see above.  Just wanted to double check. Thanks!

## 2015-11-08 NOTE — Telephone Encounter (Signed)
I faxed RF and spoke to pt to clarify that the RF was sent and she just needs to RTC before it runs out. Pt agreed.

## 2015-11-10 ENCOUNTER — Ambulatory Visit (INDEPENDENT_AMBULATORY_CARE_PROVIDER_SITE_OTHER): Payer: Medicare Other | Admitting: Family Medicine

## 2015-11-10 VITALS — BP 132/80 | HR 76 | Temp 98.1°F | Resp 16 | Ht 60.0 in | Wt 130.2 lb

## 2015-11-10 DIAGNOSIS — B0229 Other postherpetic nervous system involvement: Secondary | ICD-10-CM | POA: Diagnosis not present

## 2015-11-10 DIAGNOSIS — R011 Cardiac murmur, unspecified: Secondary | ICD-10-CM

## 2015-11-10 DIAGNOSIS — I10 Essential (primary) hypertension: Secondary | ICD-10-CM | POA: Diagnosis not present

## 2015-11-10 DIAGNOSIS — N189 Chronic kidney disease, unspecified: Secondary | ICD-10-CM

## 2015-11-10 DIAGNOSIS — F0390 Unspecified dementia without behavioral disturbance: Secondary | ICD-10-CM | POA: Diagnosis not present

## 2015-11-10 LAB — POCT URINALYSIS DIP (MANUAL ENTRY)
BILIRUBIN UA: NEGATIVE
GLUCOSE UA: NEGATIVE
NITRITE UA: NEGATIVE
Protein Ur, POC: 30 — AB
RBC UA: NEGATIVE
Spec Grav, UA: 1.015
UROBILINOGEN UA: 1
pH, UA: 6

## 2015-11-10 LAB — POC MICROSCOPIC URINALYSIS (UMFC): Mucus: ABSENT

## 2015-11-10 LAB — COMPREHENSIVE METABOLIC PANEL
ALK PHOS: 58 U/L (ref 33–130)
ALT: 5 U/L — AB (ref 6–29)
AST: 15 U/L (ref 10–35)
Albumin: 3.9 g/dL (ref 3.6–5.1)
BILIRUBIN TOTAL: 0.4 mg/dL (ref 0.2–1.2)
BUN: 21 mg/dL (ref 7–25)
CALCIUM: 9.7 mg/dL (ref 8.6–10.4)
CO2: 26 mmol/L (ref 20–31)
CREATININE: 1.16 mg/dL — AB (ref 0.60–0.88)
Chloride: 101 mmol/L (ref 98–110)
GLUCOSE: 92 mg/dL (ref 65–99)
Potassium: 3.5 mmol/L (ref 3.5–5.3)
SODIUM: 141 mmol/L (ref 135–146)
Total Protein: 7.1 g/dL (ref 6.1–8.1)

## 2015-11-10 LAB — POCT CBC
GRANULOCYTE PERCENT: 71.4 % (ref 37–80)
HEMATOCRIT: 37.5 % — AB (ref 37.7–47.9)
HEMOGLOBIN: 13.2 g/dL (ref 12.2–16.2)
LYMPH, POC: 2 (ref 0.6–3.4)
MCH, POC: 32.4 pg — AB (ref 27–31.2)
MCHC: 35.1 g/dL (ref 31.8–35.4)
MCV: 92.3 fL (ref 80–97)
MID (cbc): 0.3 (ref 0–0.9)
MPV: 8.3 fL (ref 0–99.8)
PLATELET COUNT, POC: 303 10*3/uL (ref 142–424)
POC GRANULOCYTE: 5.6 (ref 2–6.9)
POC LYMPH PERCENT: 24.7 %L (ref 10–50)
POC MID %: 3.9 %M (ref 0–12)
RBC: 4.06 M/uL (ref 4.04–5.48)
RDW, POC: 13.7 %
WBC: 7.9 10*3/uL (ref 4.6–10.2)

## 2015-11-10 MED ORDER — GABAPENTIN 100 MG PO CAPS: ORAL_CAPSULE | ORAL | Status: DC

## 2015-11-10 MED ORDER — LIDOCAINE 5 % EX PTCH: 1.0000 | MEDICATED_PATCH | CUTANEOUS | Status: DC

## 2015-11-10 MED ORDER — PANTOPRAZOLE SODIUM 40 MG PO TBEC: DELAYED_RELEASE_TABLET | ORAL | Status: DC

## 2015-11-10 MED ORDER — LORAZEPAM 0.5 MG PO TABS: 0.2500 mg | ORAL_TABLET | Freq: Two times a day (BID) | ORAL | Status: DC | PRN

## 2015-11-10 MED ORDER — AMLODIPINE BESYLATE 5 MG PO TABS: ORAL_TABLET | ORAL | Status: DC

## 2015-11-10 MED ORDER — HYDROCHLOROTHIAZIDE 25 MG PO TABS: ORAL_TABLET | ORAL | Status: DC

## 2015-11-10 MED ORDER — POTASSIUM CHLORIDE CRYS ER 10 MEQ PO TBCR: EXTENDED_RELEASE_TABLET | ORAL | Status: DC

## 2015-11-10 NOTE — Progress Notes (Signed)
Subjective:  By signing my name below, I, Raven Small, attest that this documentation has been prepared under the direction and in the presence of [name], MD.  Electronically Signed: Thea Alken, ED Scribe. 11/10/2015. 2:24 PM.    Patient ID: Kylie Allen, female    DOB: 10/22/1923, 82 y.o.   MRN: [id number]  HPI Chief Complaint  Patient presents with  . Flank Pain    right side    HPI Comments: Kylie Allen is a 82 y.o. female who presents to the [facility] complaining of left flank pain.  Pt locates pain and skin sensitivity to left flank and left back. Pt has been on gabapentin for several year and denies adverse effects. She states medication helps with the pain sometimes. He daughter states pt has complained that medication does not work all but also states pt does not take medication as prescribed, not taking enough it. Pt does not like to take pain medication due to fear of drug overdose and addiction. She has never tried a lidocaine patch for pain. Pt tries to take medication every day but admits to occasionally missing a doses due to forgetfulness.  Pt has lived in independent living for 10 years which she likes. She has thought about assisted living but has not found a place that she's liked yet. She's felt that the living quarters are smaller in theses types of setting.   Last saw cardio 2015 was found to have PVC's and occasional SBT after being seen in 2012 for bradycardia. At that point she was point on metoprolol 12.5 bid however there was no documented murmur. Echocardiogram done 01/2014 showed EF 55-60%, increased artery pulmonary pressure 54 mmgHg. moderate tricuspid regurg. Mild AI. Last visit with nephrology was 05/2014. Seen by Dr. [name]. Was noted to have secondary hyperparathyroidism along with anemia for CDK due to nephrosclerosis.   Pt agrees to [facility] senior care  Patient Active Problem List   Diagnosis Date Noted  . Dementia 08/26/2014    . Bradycardia 04/19/2011  . Hyperlipidemia 05/10/2009  . DEPRESSION 05/10/2009  . MIGRAINE HEADACHE 05/10/2009  . Essential hypertension 05/10/2009  . GERD 05/10/2009  . CHRONIC KIDNEY DISEASE UNSPECIFIED 05/10/2009  . BREAST CANCER, HX OF 05/10/2009  . SYNCOPE, HX OF 05/10/2009  . DIVERTICULITIS, HX OF 05/10/2009  . UTI'S, HX OF 05/10/2009   Past Medical History  Diagnosis Date  . Breast cancer (HCC)     hx of  . Depression with anxiety   . Diverticulosis     hx of  . GERD (gastroesophageal reflux disease)   . Hyperlipidemia   . Hypertension   . CKD (chronic kidney disease)   . H/O: hysterectomy   . Bradycardia   . Fatigue   . History of echocardiogram     Echo (8/15): EF 55-60%, normal wall motion, grade 1 diastolic dysfunction, mild AI, moderate TR, PASP 54 mm Hg   Past Surgical History  Procedure Laterality Date  . Mastectomy Right    No Known Allergies Prior to Admission medications   Medication Sig Start Date End Date Taking? Authorizing Provider  amLODipine (NORVASC) 5 MG tablet TAKE 1 TABLET(5 MG) BY MOUTH DAILY 09/03/15  Yes Shawnee Knapp, MD  aspirin 325 MG tablet Take 325 mg by mouth as needed.    Yes Historical Provider, MD  gabapentin (NEURONTIN) 100 MG capsule TAKE 1 CAPSULE BY MOUTH THREE TIMES DAILY AS NEEDED FOR POST-SHINGLES PAIN AND TAKE 3 CAPSULES EVERY NIGHT 10/30/15  Yes Shawnee Knapp, MD  hydrochlorothiazide (HYDRODIURIL) 25 MG tablet TAKE 1 TABLET(25 MG) BY MOUTH DAILY 09/03/15  Yes Shawnee Knapp, MD  LORazepam (ATIVAN) 0.5 MG tablet Take 0.5-1 tablets (0.25-0.5 mg total) by mouth 2 (two) times daily as needed for anxiety. Needs OV for additional refills. 11/05/15  Yes Shawnee Knapp, MD  Multiple Vitamin (MULTIVITAMIN WITH MINERALS) TABS tablet Take 1 tablet by mouth daily.   Yes Historical Provider, MD  pantoprazole (PROTONIX) 40 MG tablet TAKE 1 TABLET BY MOUTH EVERY DAY 04/27/15  Yes Shawnee Knapp, MD  potassium chloride (K-DUR,KLOR-CON) 10 MEQ tablet TAKE ONE(1)  TABLET BY MOUTH EVERY DAY 05/16/15  Yes Shawnee Knapp, MD   Depression screen [facility] 2/9 11/10/2015 04/27/2015 11/11/2014  Decreased Interest 0 0 3  Down, Depressed, Hopeless 0 1 3  PHQ - 2 Score 0 1 6  Altered sleeping - - 1  Tired, decreased energy - - 3  Change in appetite - - 1  Feeling bad or failure about yourself  - - 0  Trouble concentrating - - 0  Moving slowly or fidgety/restless - - 1  Suicidal thoughts - - 0  PHQ-9 Score - - 12  Difficult doing work/chores - - Somewhat difficult     Review of Systems  Constitutional: Positive for activity change and fatigue. Negative for fever, chills, appetite change and unexpected weight change.  Eyes: Negative for visual disturbance.  Respiratory: Negative for cough.   Cardiovascular: Negative for leg swelling.  Genitourinary: Positive for flank pain.  Musculoskeletal: Positive for myalgias, back pain, arthralgias and gait problem. Negative for joint swelling.  Skin: Negative for color change and rash.  Neurological: Negative for dizziness, weakness, light-headedness and numbness.  Psychiatric/Behavioral: Positive for sleep disturbance. Negative for dysphoric mood. The patient is not nervous/anxious.     Objective:   Physical Exam  Constitutional: She is oriented to person, place, and time. She appears well-developed and well-nourished. No distress.  HENT:  Head: Normocephalic and atraumatic.  Eyes: Conjunctivae and EOM are normal.  Neck: Neck supple.  Cardiovascular: Normal rate.  An irregularly irregular rhythm present.  Murmur ( left upper strenal border) heard.  Decrescendo systolic murmur is present with a grade of 2/6  Expect frequent PVC's   Pulmonary/Chest: Effort normal. She has rhonchi. She has rales.  Bibasilar rales and rhonchi that clear with deep breathing.   Musculoskeletal: Normal range of motion.  No rashes or lesions. Hyperaesthetic to palpation over left upper lumbar paraspinal  Neurological: She is alert and  oriented to person, place, and time.  Skin: Skin is warm and dry.  Psychiatric: She has a normal mood and affect. Her behavior is normal.  Nursing note and vitals reviewed.   Filed Vitals:   11/10/15 1333  BP: 160/78  Pulse: 76  Temp: 98.1 F (36.7 C)  TempSrc: Oral  Resp: 16  Height: 5' (1.524 m)  Weight: 130 lb 3.2 oz (59.058 kg)  SpO2: 98%    Results for orders placed or performed in visit on 11/10/15  POCT CBC  Result Value Ref Range   WBC 7.9 4.6 - 10.2 K/uL   Lymph, poc 2.0 0.6 - 3.4   POC LYMPH PERCENT 24.7 10 - 50 %L   MID (cbc) 0.3 0 - 0.9   POC MID % 3.9 0 - 12 %M   POC Granulocyte 5.6 2 - 6.9   Granulocyte percent 71.4 37 - 80 %G   RBC 4.06 4.04 - 5.48  M/uL   Hemoglobin 13.2 12.2 - 16.2 g/dL   HCT, POC 37.5 (A) 37.7 - 47.9 %   MCV 92.3 80 - 97 fL   MCH, POC 32.4 (A) 27 - 31.2 pg   MCHC 35.1 31.8 - 35.4 g/dL   RDW, POC 13.7 %   Platelet Count, POC 303 142 - 424 K/uL   MPV 8.3 0 - 99.8 fL    Assessment & Plan:   1. CHRONIC KIDNEY DISEASE UNSPECIFIED  - borderline stage 4, refer to nephrology if worsens  2. Dementia, without behavioral disturbance  - mild but present - i did tell pt that she was exhibiting sxs of memory loss today but pt in denial. Would be good to get baseline mmse at f/u. Encouraged pt to try to walk in hall of retirement home and cons moving into assisted living so she can have aide with med administration but pt resistant to latter.  Pt's daughter seen at [facility] by Dr. Dewaine Oats - as pt's prior PCP Dr. Lorelei Pont has resigned from [facility] it seems a good time for pt to transfer her care to Dr. Dewaine Oats as well as daughter has desired for some time - Pt could definitely benefit from the insight of a geritrician considering her sxs are uncontrolled on current regimen.  3. Essential hypertension   4. Postherpetic neuralgia   5. Cardiac murmur - new, saw Richardson Dopp last yr so will ask for pt to f/u again  6.       Chronic back pain -  daughter reports pt not complying with gabapentin but then complains constantly and won't do things due to pain - pt disagrees. Try top lidocaine patch as sxs are consistent with a radiculopathy  Orders Placed This Encounter  Procedures  . Urine culture  . Comprehensive metabolic panel  . Ambulatory referral to Geriatrics    Referral Priority:  Routine    Referral Type:  Consultation    Referral Reason:  Specialty Services Required    Referred to Provider:  Lauree Chandler, NP    Requested Specialty:  Geriatric Medicine    Number of Visits Requested:  1  . Ambulatory referral to Cardiology    Referral Priority:  Routine    Referral Type:  Consultation    Referral Reason:  Specialty Services Required    Referred to Provider:  Liliane Shi, PA-C    Requested Specialty:  Cardiology    Number of Visits Requested:  1  . POCT Microscopic Urinalysis ([facility])  . POCT urinalysis dipstick  . POCT CBC    Meds ordered this encounter  Medications  . lidocaine (LIDODERM) 5 %    Sig: Place 1 patch onto the skin daily. Remove & Discard patch within 12 hours or as directed by MD    Dispense:  30 patch    Refill:  5  . pantoprazole (PROTONIX) 40 MG tablet    Sig: TAKE 1 TABLET BY MOUTH EVERY DAY    Dispense:  30 tablet    Refill:  5  . LORazepam (ATIVAN) 0.5 MG tablet    Sig: Take 0.5-1 tablets (0.25-0.5 mg total) by mouth 2 (two) times daily as needed for anxiety. Needs OV for additional refills.    Dispense:  60 tablet    Refill:  5    Needs OV for additional refills.  . potassium chloride (K-DUR,KLOR-CON) 10 MEQ tablet    Sig: TAKE ONE(1) TABLET BY MOUTH EVERY DAY    Dispense:  30 tablet    Refill:  5  . hydrochlorothiazide (HYDRODIURIL) 25 MG tablet    Sig: TAKE 1 TABLET(25 MG) BY MOUTH DAILY    Dispense:  30 tablet    Refill:  5  . amLODipine (NORVASC) 5 MG tablet    Sig: TAKE 1 TABLET(5 MG) BY MOUTH DAILY    Dispense:  30 tablet    Refill:  5  . gabapentin (NEURONTIN) 100 MG  capsule    Sig: TAKE 1 CAPSULE BY MOUTH THREE TIMES DAILY AS NEEDED FOR POST-SHINGLES PAIN AND TAKE 3 CAPSULES EVERY NIGHT    Dispense:  180 capsule    Refill:  5    **Patient requests 90 days supply**    I personally performed the services described in this documentation, which was scribed in my presence. The recorded information has been reviewed and considered, and addended by me as needed.  [name], MD MPH

## 2015-11-10 NOTE — Patient Instructions (Addendum)
IF you received an x-ray today, you will receive an invoice from [facility]. Please contact [facility] at [phone] with questions or concerns regarding your invoice.   IF you received labwork today, you will receive an invoice from [employer]. Please contact [facility] at [phone] with questions or concerns regarding your invoice.   Our billing staff will not be able to assist you with questions regarding bills from these companies.  You will be contacted with the lab results as soon as they are available. The fastest way to get your results is to activate your My Chart account. Instructions are located on the last page of this paperwork. If you have not heard from [place] regarding the results in 2 weeks, please contact this office.   Memory Compensation Strategies  1. Use "WARM" strategy.  W= write it down  A= associate it  R= repeat it  M= make a mental note  2.   You can keep a Social worker.  Use a 3-ring notebook with sections for the following: calendar, important names and phone numbers,  medications, doctors' names/phone numbers, lists/reminders, and a section to journal what you did  each day.   3.    Use a calendar to write appointments down.  4.    Write yourself a schedule for the day.  This can be placed on the calendar or in a separate section of the Memory Notebook.  Keeping a  regular schedule can help memory.  5.    Use medication organizer with sections for each day or morning/evening pills.  You may need help loading it  6.    Keep a basket, or pegboard by the door.  Place items that you need to take out with you in the basket or on the pegboard.  You may also want to  include a message board for reminders.  7.    Use sticky notes.  Place sticky notes with reminders in a place where the task is performed.  For example: " turn off the  stove" placed by the stove, "lock the door" placed on the door at eye  level, " take your medications" on  the bathroom mirror or by the place where you normally take your medications.  8.    Use alarms/timers.  Use while cooking to remind yourself to check on food or as a reminder to take your medicine, or as a  reminder to make a call, or as a reminder to perform another task, etc.   Management of Memory Problems  There are some general things you can do to help manage your memory problems.  Your memory may not in fact recover, but by using techniques and strategies you will be able to manage your memory difficulties better.  1)  Establish a routine.  Try to establish and then stick to a regular routine.  By doing this, you will get used to what to expect and you will reduce the need to rely on your memory.  Also, try to do things at the same time of day, such as taking your medication or checking your calendar first thing in the morning.  Think about think that you can do as a part of a regular routine and make a list.  Then enter them into a daily planner to remind you.  This will help you establish a routine.  2)  Organize your environment.  Organize your environment so that it is uncluttered.  Decrease visual  stimulation.  Place everyday items such as keys or cell phone in the same place every day (ie.  Basket next to front door)  Use post it notes with a brief message to yourself (ie. Turn off light, lock the door)  Use labels to indicate where things go (ie. Which cupboards are for food, dishes, etc.)  Keep a notepad and pen by the telephone to take messages  3)  Memory Aids  A diary or journal/notebook/daily planner  Making a list (shopping list, chore list, to do list that needs to be done)  Using an alarm as a reminder (kitchen timer or cell phone alarm)  Using cell phone to store information (Notes, Calendar, Reminders)  Calendar/White board placed in a prominent position  Post-it notes  In order for memory aids to be useful, you need  to have good habits.  It's no good remembering to make a note in your journal if you don't remember to look in it.  Try setting aside a certain time of day to look in journal.  4)  Improving mood and managing fatigue.  There may be other factors that contribute to memory difficulties.  Factors, such as anxiety, depression and tiredness can affect memory.  Regular gentle exercise can help improve your mood and give you more energy.  Simple relaxation techniques may help relieve symptoms of anxiety  Try to get back to completing activities or hobbies you enjoyed doing in the past.  Learn to pace yourself through activities to decrease fatigue.  Find out about some local support groups where you can share experiences with others.  Try and achieve 7-8 hours of sleep at night.

## 2015-11-11 ENCOUNTER — Encounter: Payer: Self-pay | Admitting: Physician Assistant

## 2015-11-12 LAB — URINE CULTURE: Colony Count: 75000

## 2015-11-16 DIAGNOSIS — Z9114 Patient's other noncompliance with medication regimen: Secondary | ICD-10-CM | POA: Insufficient documentation

## 2015-11-24 ENCOUNTER — Encounter: Payer: Self-pay | Admitting: Family Medicine

## 2015-11-29 ENCOUNTER — Ambulatory Visit: Payer: Self-pay | Admitting: Physician Assistant

## 2015-12-03 ENCOUNTER — Other Ambulatory Visit: Payer: Self-pay | Admitting: Family Medicine

## 2015-12-29 NOTE — Progress Notes (Signed)
Cardiology Office Note:    Date:  12/30/2015   ID:  Kylie Allen, DOB 08/29/1923, MRN [id number]  PCP:  [name], MD  Cardiologist:  Dr. Sherren Mocha   Electrophysiologist:  N/a Nephrologist:  Dr. [name]  Referring MD: Shawnee Knapp, MD   Chief Complaint  Patient presents with  . Heart Murmur    History of Present Illness:     Kylie Allen is a 82 y.o. female with a hx of HTN, HL, chronic kidney disease. She was evaluated by Dr. Burt Knack in 2012 for bradycardia and fatigue. Patient underwent Holter monitor which demonstrated sinus rhythm and no sustained arrhythmias. She was seen by primary care in July. She was noted to have frequent PVCs. Holter monitor demonstrated sinus rhythm with frequent PVCs and a few runs of paroxysmal supraventricular tachycardia. She was started on metoprolol.  Echo in 8/15 demonstrated normal LVF. Last seen 9/15.  Patient was recently seen by PCP. She was noted to have a systolic murmur. She is referred back for further evaluation.    Past Medical History  Diagnosis Date  . Breast cancer (HCC)     hx of  . Depression with anxiety   . Diverticulosis     hx of  . GERD (gastroesophageal reflux disease)   . Hyperlipidemia   . Hypertension   . CKD (chronic kidney disease)   . H/O: hysterectomy   . Bradycardia   . Fatigue   . History of echocardiogram     Echo (8/15): EF 55-60%, normal wall motion, grade 1 diastolic dysfunction, mild AI, moderate TR, PASP 54 mm Hg    Past Surgical History  Procedure Laterality Date  . Mastectomy Right     Current Medications: Outpatient Prescriptions Prior to Visit  Medication Sig Dispense Refill  . amLODipine (NORVASC) 5 MG tablet TAKE 1 TABLET(5 MG) BY MOUTH DAILY 30 tablet 5  . aspirin 325 MG tablet Take 325 mg by mouth as needed.     . gabapentin (NEURONTIN) 100 MG capsule TAKE 1 CAPSULE BY MOUTH THREE TIMES DAILY AS NEEDED FOR POST-SHINGLES PAIN AND TAKE 3 CAPSULES EVERY NIGHT 180 capsule 5    . hydrochlorothiazide (HYDRODIURIL) 25 MG tablet TAKE 1 TABLET(25 MG) BY MOUTH DAILY 30 tablet 5  . lidocaine (LIDODERM) 5 % Place 1 patch onto the skin daily. Remove & Discard patch within 12 hours or as directed by MD 30 patch 5  . LORazepam (ATIVAN) 0.5 MG tablet Take 0.5-1 tablets (0.25-0.5 mg total) by mouth 2 (two) times daily as needed for anxiety. Needs OV for additional refills. 60 tablet 5  . Multiple Vitamin (MULTIVITAMIN WITH MINERALS) TABS tablet Take 1 tablet by mouth daily.    . pantoprazole (PROTONIX) 40 MG tablet TAKE 1 TABLET BY MOUTH EVERY DAY 30 tablet 5  . potassium chloride (K-DUR,KLOR-CON) 10 MEQ tablet TAKE ONE(1) TABLET BY MOUTH EVERY DAY 30 tablet 5   No facility-administered medications prior to visit.      Allergies:   Review of patient's allergies indicates no known allergies.   Social History   Social History  . Marital Status: Widowed    Spouse Name: N/A  . Number of Children: N/A  . Years of Education: N/A   Social History Main Topics  . Smoking status: Former Research scientist (life sciences)  . Smokeless tobacco: Not on file  . Alcohol Use: No  . Drug Use: Not on file  . Sexual Activity: Not on file   Other Topics Concern  . Not on file  Social History Narrative     Family History:  The patient's family history includes Coronary artery disease in her mother; Hypertension in her mother; Stroke in her sister. There is no history of Heart attack.   ROS:   Please see the history of present illness.    ROS All other systems reviewed and are negative.   Physical Exam:    VS:  There were no vitals taken for this visit.   Physical Exam  Wt Readings from Last 3 Encounters:  11/10/15 130 lb 3.2 oz (59.058 kg)  04/27/15 134 lb 2 oz (60.839 kg)  11/11/14 130 lb (58.968 kg)      Studies/Labs Reviewed:     EKG:  EKG is  ordered today.  The ekg ordered today demonstrates   Recent Labs: 11/10/2015: ALT 5*; BUN 21; Creat 1.16*; Hemoglobin 13.2; Potassium 3.5; Sodium  141   Recent Lipid Panel    Component Value Date/Time   CHOL 177 08/17/2012 1537   TRIG 85 08/17/2012 1537   HDL 72 08/17/2012 1537   CHOLHDL 2.5 08/17/2012 1537   VLDL 17 08/17/2012 1537   LDLCALC 88 08/17/2012 1537    Additional studies/ records that were reviewed today include:    Echocardiogram (01/2014):  EF 55-60%, no RWMA, Gr 1 DD, mild AI, mod TR, PASP 54 mmHg   ASSESSMENT:     1. Cardiac murmur   2. PVC's (premature ventricular contractions)   3. SVT (supraventricular tachycardia) ([place])   4. Essential hypertension     PLAN:     In order of problems listed above:  1.   2. PVCs -   3. PSVT -   4. HTN -    Medication Adjustments/Labs and Tests Ordered: Current medicines are reviewed at length with the patient today.  Concerns regarding medicines are outlined above.  Medication changes, Labs and Tests ordered today are outlined in the Patient Instructions noted below. There are no Patient Instructions on file for this visit. Signed, Richardson Dopp, PA-C  12/30/2015 1:01 PM    [facility] [place], [place], [place]  [address] Phone: [phone]; Fax: [phone]     This encounter was created in error - please disregard.

## 2015-12-30 ENCOUNTER — Encounter: Payer: Medicare Other | Admitting: Physician Assistant

## 2015-12-30 DIAGNOSIS — R0989 Other specified symptoms and signs involving the circulatory and respiratory systems: Secondary | ICD-10-CM

## 2015-12-30 DIAGNOSIS — I493 Ventricular premature depolarization: Secondary | ICD-10-CM | POA: Insufficient documentation

## 2015-12-30 DIAGNOSIS — I471 Supraventricular tachycardia: Secondary | ICD-10-CM | POA: Insufficient documentation

## 2016-01-05 ENCOUNTER — Encounter: Payer: Self-pay | Admitting: Physician Assistant

## 2016-04-28 ENCOUNTER — Other Ambulatory Visit: Payer: Self-pay | Admitting: Family Medicine

## 2016-05-01 ENCOUNTER — Other Ambulatory Visit: Payer: Self-pay

## 2016-05-01 MED ORDER — LORAZEPAM 0.5 MG PO TABS: 0.2500 mg | ORAL_TABLET | Freq: Two times a day (BID) | ORAL | 0 refills | Status: DC | PRN

## 2016-05-01 NOTE — Telephone Encounter (Signed)
Pt's grand daughter asked me to check status of RF req for Ativan. I do not see a req for this, but did refill 5 other meds req'd for 1 mos w/note needs OV for more. Pt was going to est care with Dr Dewaine Oats according to plan at last OV. In May. Dr Brigitte Pulse, do you want to give pt one month of the ativan also?

## 2016-05-02 NOTE — Telephone Encounter (Signed)
Called in and notified grand daughter.

## 2016-06-28 ENCOUNTER — Other Ambulatory Visit: Payer: Self-pay | Admitting: Family Medicine

## 2016-06-30 NOTE — Telephone Encounter (Signed)
SS refill req Lorazepam

## 2016-07-07 ENCOUNTER — Telehealth: Payer: Self-pay

## 2016-07-07 NOTE — Telephone Encounter (Signed)
Pt is checking on status of a refill request for lorazepam which she has been out of for 3 days  Now   Best number [phone]   [place]

## 2016-07-07 NOTE — Telephone Encounter (Signed)
Ok to call in #30 tabs but please make sure pt and one of her family members know that she will need an office visit for any additional refills - pt was informed of that at her [facility] in May and that was also noted at her last ativan refill in Nov.  However, pt is in assisted living and she does have some age-associated dementia so please let a family member know as this well.

## 2016-07-07 NOTE — Telephone Encounter (Signed)
Pt made aware medication reordered and called in to [facility] 30 day supply given

## 2016-07-07 NOTE — Telephone Encounter (Signed)
Pt made aware no additional refills will be prescribed until seen by provider. Medication called in to pharmacy

## 2016-07-09 NOTE — Telephone Encounter (Signed)
Did you let her family member know that she needs an appt - see below note, thanks.

## 2016-07-10 NOTE — Telephone Encounter (Signed)
Pt advised an di offered to tx up front for appt, she states shell call back

## 2016-07-12 NOTE — Telephone Encounter (Signed)
See my note below - pt has moderate dementia, is in assisted living. She is NOT going to remember to schedule. Please call her emergency contact and let them know that pt will need to be seen for any additional refills of her ativan and to be sure to bring all medication bottles to the visit. Thanks.

## 2016-07-14 NOTE — Telephone Encounter (Signed)
Please call her for an appt

## 2016-08-12 ENCOUNTER — Encounter (HOSPITAL_COMMUNITY): Payer: Self-pay | Admitting: Emergency Medicine

## 2016-08-12 ENCOUNTER — Ambulatory Visit (HOSPITAL_COMMUNITY)
Admission: EM | Admit: 2016-08-12 | Discharge: 2016-08-12 | Disposition: A | Discharge status: Home / Self Care | Payer: Medicare Other | Source: Home / Self Care

## 2016-08-12 ENCOUNTER — Emergency Department (HOSPITAL_COMMUNITY)
Admission: EM | Admit: 2016-08-12 | Discharge: 2016-08-12 | Disposition: A | Discharge status: Home / Self Care | Payer: Medicare Other | Attending: Emergency Medicine | Admitting: Emergency Medicine

## 2016-08-12 ENCOUNTER — Ambulatory Visit: Payer: Medicare Other

## 2016-08-12 DIAGNOSIS — Z87891 Personal history of nicotine dependence: Secondary | ICD-10-CM | POA: Insufficient documentation

## 2016-08-12 DIAGNOSIS — N189 Chronic kidney disease, unspecified: Secondary | ICD-10-CM | POA: Diagnosis not present

## 2016-08-12 DIAGNOSIS — I129 Hypertensive chronic kidney disease with stage 1 through stage 4 chronic kidney disease, or unspecified chronic kidney disease: Secondary | ICD-10-CM | POA: Diagnosis not present

## 2016-08-12 DIAGNOSIS — Z7982 Long term (current) use of aspirin: Secondary | ICD-10-CM | POA: Diagnosis not present

## 2016-08-12 DIAGNOSIS — Z760 Encounter for issue of repeat prescription: Secondary | ICD-10-CM | POA: Diagnosis not present

## 2016-08-12 DIAGNOSIS — Z853 Personal history of malignant neoplasm of breast: Secondary | ICD-10-CM | POA: Insufficient documentation

## 2016-08-12 MED ORDER — ONDANSETRON 4 MG PO TBDP
8.0000 mg | ORAL_TABLET | Freq: Once | ORAL | Status: AC
  Administered 2016-08-12: 8 mg via ORAL
  Filled 2016-08-12: qty 2

## 2016-08-12 MED ORDER — LORAZEPAM 0.5 MG PO TABS
0.5000 mg | ORAL_TABLET | Freq: Once | ORAL | Status: AC
  Administered 2016-08-12: 0.5 mg via ORAL
  Filled 2016-08-12: qty 1

## 2016-08-12 MED ORDER — LORAZEPAM 0.5 MG PO TABS: 0.2500 mg | ORAL_TABLET | Freq: Two times a day (BID) | ORAL | 0 refills | Status: DC | PRN

## 2016-08-12 NOTE — ED Triage Notes (Signed)
Pt cannot be seen at [facility] until she starts a payment plan.  Pt is out of her Ativan and gets very sick when she is off it.

## 2016-08-12 NOTE — ED Provider Notes (Signed)
[facility] DEPT Provider Note   CSN: [id number] Arrival date & time: 08/12/16  1545     History   Chief Complaint Chief Complaint  Patient presents with  . Medication Refill    HPI Kylie Allen is a 83 y.o. female.  HPI Kylie Allen is a 83 y.o. female presents to ED requesting medication refill. Pt states she has been taking "ativen for 60 years." States takes 0.5mg  once a day "for the nerves." reports when she went to her PCP for refill was denied bc she owed some money. Pt's daughter states they were not aware of this and will take care of this on Monday. Meanwhile, pt took last tablet yesterday. States this afternoon felt anxious and nauseated. Went to [facility] where they were unable to refill medication. Pt's daughter is worried pt will start to withdraw over the weekend until she is able to be seen on Monday or Tuesday. Pt states she feels nauseated and vomited in the waiting room. Denies any abd pain, no other associated symptoms.   Past Medical History:  Diagnosis Date  . Bradycardia   . Breast cancer (HCC)    hx of  . CKD (chronic kidney disease)   . Depression with anxiety   . Diverticulosis    hx of  . Fatigue   . GERD (gastroesophageal reflux disease)   . H/O: hysterectomy   . History of echocardiogram    Echo (8/15): EF 55-60%, normal wall motion, grade 1 diastolic dysfunction, mild AI, moderate TR, PASP 54 mm Hg  . Hyperlipidemia   . Hypertension     Patient Active Problem List   Diagnosis Date Noted  . PVC's (premature ventricular contractions) 12/30/2015  . SVT (supraventricular tachycardia) ([facility]) 12/30/2015  . Dementia 08/26/2014  . Bradycardia 04/19/2011  . Hyperlipidemia 05/10/2009  . DEPRESSION 05/10/2009  . MIGRAINE HEADACHE 05/10/2009  . Essential hypertension 05/10/2009  . GERD 05/10/2009  . CHRONIC KIDNEY DISEASE UNSPECIFIED 05/10/2009  . BREAST CANCER, HX OF 05/10/2009  . SYNCOPE, HX OF 05/10/2009  . DIVERTICULITIS, HX OF 05/10/2009    . UTI'S, HX OF 05/10/2009    Past Surgical History:  Procedure Laterality Date  . MASTECTOMY Right     OB History    No data available       Home Medications    Prior to Admission medications   Medication Sig Start Date End Date Taking? Authorizing Provider  amLODipine (NORVASC) 5 MG tablet TAKE 1 TABLET BY MOUTH EVERY DAY 05/01/16   Shawnee Knapp, MD  aspirin 325 MG tablet Take 325 mg by mouth as needed.     Historical Provider, MD  gabapentin (NEURONTIN) 100 MG capsule TAKE 1 CAPSULE BY MOUTH 3 TIMES DAILY AS NEEDED FOR post SHINGLES PAIN AND 3 CAPSULES EVERY EVENING 05/01/16   Shawnee Knapp, MD  hydrochlorothiazide (HYDRODIURIL) 25 MG tablet TAKE 1 TABLET BY MOUTH EVERY DAY 05/01/16   Shawnee Knapp, MD  lidocaine (LIDODERM) 5 % Place 1 patch onto the skin daily. Remove & Discard patch within 12 hours or as directed by MD 11/10/15   Shawnee Knapp, MD  LORazepam (ATIVAN) 0.5 MG tablet Take 0.5-1 tablets (0.25-0.5 mg total) by mouth 2 (two) times daily as needed for anxiety. 05/01/16   Shawnee Knapp, MD  Multiple Vitamin (MULTIVITAMIN WITH MINERALS) TABS tablet Take 1 tablet by mouth daily.    Historical Provider, MD  pantoprazole (PROTONIX) 40 MG tablet TAKE 1 TABLET BY MOUTH EVERY DAY 05/01/16  Shawnee Knapp, MD  potassium chloride (K-DUR) 10 MEQ tablet TAKE 1 TABLET BY MOUTH EVERY DAY 05/01/16   Shawnee Knapp, MD  potassium chloride (K-DUR,KLOR-CON) 10 MEQ tablet TAKE ONE(1) TABLET BY MOUTH EVERY DAY 11/10/15   Shawnee Knapp, MD    Family History Family History  Problem Relation Age of Onset  . Coronary artery disease Mother     family hx of  . Hypertension Mother     hx of  . Stroke Sister   . Heart attack Neg Hx     Social History Social History  Substance Use Topics  . Smoking status: Former Research scientist (life sciences)  . Smokeless tobacco: Never Used  . Alcohol use No     Allergies   Patient has no known allergies.   Review of Systems Review of Systems  Constitutional: Negative for chills and  fever.  Respiratory: Negative for cough, chest tightness and shortness of breath.   Cardiovascular: Negative for chest pain, palpitations and leg swelling.  Gastrointestinal: Positive for nausea. Negative for abdominal pain, diarrhea and vomiting.  Genitourinary: Negative for dysuria, flank pain and pelvic pain.  Musculoskeletal: Negative for arthralgias, myalgias, neck pain and neck stiffness.  Skin: Negative for rash.  Neurological: Negative for dizziness, weakness and headaches.  Psychiatric/Behavioral: The patient is nervous/anxious.   All other systems reviewed and are negative.    Physical Exam Updated Vital Signs BP 151/58 (BP Location: Left Arm)   Pulse 83   Temp 98.1 F (36.7 C) (Oral)   Resp 18   Ht 5' (1.524 m)   Wt 59 kg   SpO2 96%   BMI 25.39 kg/m   Physical Exam  Constitutional: She is oriented to person, place, and time. She appears well-developed and well-nourished. No distress.  HENT:  Head: Normocephalic.  Eyes: Conjunctivae are normal.  Neck: Neck supple.  Cardiovascular: Normal rate, regular rhythm and normal heart sounds.   Pulmonary/Chest: Effort normal and breath sounds normal. No respiratory distress. She has no wheezes. She has no rales.  Abdominal: Soft. Bowel sounds are normal. She exhibits no distension. There is no tenderness. There is no rebound.  Musculoskeletal: She exhibits no edema.  Neurological: She is alert and oriented to person, place, and time.  Skin: Skin is warm and dry.  Psychiatric: She has a normal mood and affect. Her behavior is normal.  Nursing note and vitals reviewed.    ED Treatments / Results  Labs (all labs ordered are listed, but only abnormal results are displayed) Labs Reviewed - No data to display  EKG  EKG Interpretation None       Radiology No results found.  Procedures Procedures (including critical care time)  Medications Ordered in ED Medications  LORazepam (ATIVAN) tablet 0.5 mg (0.5 mg Oral  Given 08/12/16 1906)  ondansetron (ZOFRAN-ODT) disintegrating tablet 8 mg (8 mg Oral Given 08/12/16 1906)     Initial Impression / Assessment and Plan / ED Course  I have reviewed the triage vital signs and the nursing notes.  Pertinent labs & imaging results that were available during my care of the patient were reviewed by me and considered in my medical decision making (see chart for details).     Pt in ED for refill of ativan. She did have 1 episode of vomiting here, however, her abdomen is benign and she no longer feels nausted. Stated that she has been "running around between three offices... And no one  Is willing to help me." reports increasing anxiety.  I am worried she may begin to withdraw, I had a long discussion with her and her daughter. I will give her her dose of ativan and 4 tablets for home until she is able to follow up. zofran given in ED as well.   Vitals:   08/12/16 1557 08/12/16 1558 08/12/16 1805 08/12/16 2044  BP:  156/76 151/58 156/62  Pulse: 87  83 69  Resp: 18  18 16   Temp: 98.1 F (36.7 C)     TempSrc: Oral     SpO2: 97%  96% 95%  Weight:  59 kg    Height:  5' (1.524 m)     Pt tolerated oral fluids in ED prior to dc  Final Clinical Impressions(s) / ED Diagnoses   Final diagnoses:  Medication refill    New Prescriptions Discharge Medication List as of 08/12/2016  8:13 PM       Jeannett Senior, PA-C 08/12/16 2333    Davonna Belling, MD 08/13/16 2322

## 2016-08-12 NOTE — ED Triage Notes (Signed)
Pt ran out of her Ativan medication. Pt last dose was yesterday. Pt unable to see her PMD Dr Brigitte Pulse due to billing issues. Pt went to urgent care here for same and was sent here due to unable to write prescription for pt.

## 2016-08-12 NOTE — Discharge Instructions (Signed)
Take ativan as prescribed. Please follow up with family doctor as soon as able.

## 2016-08-13 ENCOUNTER — Telehealth: Payer: Self-pay | Admitting: Family Medicine

## 2016-08-13 DIAGNOSIS — N183 Chronic kidney disease, stage 3 unspecified: Secondary | ICD-10-CM

## 2016-08-13 DIAGNOSIS — Z5181 Encounter for therapeutic drug level monitoring: Secondary | ICD-10-CM

## 2016-08-13 DIAGNOSIS — B0229 Other postherpetic nervous system involvement: Secondary | ICD-10-CM

## 2016-08-13 MED ORDER — AMLODIPINE BESYLATE 5 MG PO TABS: 5.0000 mg | ORAL_TABLET | Freq: Every day | ORAL | 2 refills | Status: DC

## 2016-08-13 MED ORDER — LORAZEPAM 0.5 MG PO TABS: 0.2500 mg | ORAL_TABLET | Freq: Two times a day (BID) | ORAL | 2 refills | Status: DC | PRN

## 2016-08-13 MED ORDER — HYDROCHLOROTHIAZIDE 25 MG PO TABS: 25.0000 mg | ORAL_TABLET | Freq: Every day | ORAL | 2 refills | Status: DC

## 2016-08-13 MED ORDER — POTASSIUM CHLORIDE ER 10 MEQ PO TBCR: 10.0000 meq | EXTENDED_RELEASE_TABLET | Freq: Every day | ORAL | 2 refills | Status: DC

## 2016-08-13 MED ORDER — GABAPENTIN 100 MG PO CAPS: ORAL_CAPSULE | ORAL | 1 refills | Status: DC

## 2016-08-13 NOTE — Telephone Encounter (Signed)
Received call from pt's granddaughter and reviewed ER note. She ran out of her lorazepam yest which she has been on for 60 years. Came into [facility] for refill but was unable to be seen due to balance which she was unaware of and could not pay at that time. She was because n/v/tremulous/agitated and concern it was from bzd w/d so pt's family took her to the ER. ER visit was reassuring and all sxs resolved after lorazepam dose so was sent home with 5 pills.  Refilled all meds x 3 mos, lorazepam called in to her pharmacy.  Her daughter will bring her in for a lab only cmp and then they will be able to deal with the bill and schedule a f/u OV within the next 3 mos (6 mos max if cmp is nml).  Nicki - I just want to make sure this pt can come in for a lab only visit this week despite her balance. Thanks!  Jeffey Janssen

## 2016-08-14 NOTE — Telephone Encounter (Signed)
Pt is demented but lives in assisted living so pt very well might just be throwing our letters away - maybe we should check with the family and see if they would like her mail to go somewhere else so this doesn't happen again.

## 2016-08-14 NOTE — Telephone Encounter (Signed)
[facility] to [facility]

## 2016-08-14 NOTE — Telephone Encounter (Signed)
Yes, we will be able to see her for labs. I have printed out a summary of the debt for the patient and family. She has been receiving statements of the debt- if this is the correct address. Thanks [employer]

## 2016-08-18 ENCOUNTER — Other Ambulatory Visit (INDEPENDENT_AMBULATORY_CARE_PROVIDER_SITE_OTHER): Payer: Medicare Other

## 2016-08-18 DIAGNOSIS — N183 Chronic kidney disease, stage 3 unspecified: Secondary | ICD-10-CM

## 2016-08-18 DIAGNOSIS — Z23 Encounter for immunization: Secondary | ICD-10-CM | POA: Diagnosis not present

## 2016-08-18 DIAGNOSIS — Z5181 Encounter for therapeutic drug level monitoring: Secondary | ICD-10-CM

## 2016-08-19 LAB — COMPREHENSIVE METABOLIC PANEL
ALK PHOS: 67 IU/L (ref 39–117)
ALT: 5 IU/L (ref 0–32)
AST: 16 IU/L (ref 0–40)
Albumin/Globulin Ratio: 1.4 (ref 1.2–2.2)
Albumin: 4.2 g/dL (ref 3.2–4.6)
BILIRUBIN TOTAL: 0.3 mg/dL (ref 0.0–1.2)
BUN / CREAT RATIO: 19 (ref 12–28)
BUN: 26 mg/dL (ref 10–36)
CHLORIDE: 100 mmol/L (ref 96–106)
CO2: 24 mmol/L (ref 18–29)
Calcium: 9.9 mg/dL (ref 8.7–10.3)
Creatinine, Ser: 1.34 mg/dL — ABNORMAL HIGH (ref 0.57–1.00)
GFR calc Af Amer: 40 mL/min/{1.73_m2} — ABNORMAL LOW (ref >59)
GFR calc non Af Amer: 35 mL/min/{1.73_m2} — ABNORMAL LOW (ref >59)
GLUCOSE: 117 mg/dL — AB (ref 65–99)
Globulin, Total: 3 g/dL (ref 1.5–4.5)
Potassium: 3.4 mmol/L — ABNORMAL LOW (ref 3.5–5.2)
SODIUM: 144 mmol/L (ref 134–144)
Total Protein: 7.2 g/dL (ref 6.0–8.5)

## 2016-08-28 ENCOUNTER — Ambulatory Visit: Payer: Medicare Other | Admitting: Family Medicine

## 2016-09-04 ENCOUNTER — Ambulatory Visit: Payer: Medicare Other | Admitting: Family Medicine

## 2016-09-04 ENCOUNTER — Ambulatory Visit (INDEPENDENT_AMBULATORY_CARE_PROVIDER_SITE_OTHER): Payer: Medicare Other | Admitting: Family Medicine

## 2016-09-04 ENCOUNTER — Encounter: Payer: Self-pay | Admitting: Family Medicine

## 2016-09-04 ENCOUNTER — Telehealth: Payer: Self-pay | Admitting: Family Medicine

## 2016-09-04 VITALS — BP 152/60 | HR 97 | Temp 99.0°F | Resp 16 | Ht 60.0 in | Wt 129.0 lb

## 2016-09-04 DIAGNOSIS — B0229 Other postherpetic nervous system involvement: Secondary | ICD-10-CM

## 2016-09-04 DIAGNOSIS — F419 Anxiety disorder, unspecified: Secondary | ICD-10-CM

## 2016-09-04 DIAGNOSIS — I10 Essential (primary) hypertension: Secondary | ICD-10-CM

## 2016-09-04 DIAGNOSIS — K219 Gastro-esophageal reflux disease without esophagitis: Secondary | ICD-10-CM

## 2016-09-04 DIAGNOSIS — N183 Chronic kidney disease, stage 3 unspecified: Secondary | ICD-10-CM

## 2016-09-04 DIAGNOSIS — I493 Ventricular premature depolarization: Secondary | ICD-10-CM | POA: Diagnosis not present

## 2016-09-04 DIAGNOSIS — G4700 Insomnia, unspecified: Secondary | ICD-10-CM | POA: Diagnosis not present

## 2016-09-04 DIAGNOSIS — F0390 Unspecified dementia without behavioral disturbance: Secondary | ICD-10-CM | POA: Diagnosis not present

## 2016-09-04 DIAGNOSIS — G894 Chronic pain syndrome: Secondary | ICD-10-CM | POA: Diagnosis not present

## 2016-09-04 MED ORDER — RANITIDINE HCL 150 MG PO TABS: 150.0000 mg | ORAL_TABLET | Freq: Two times a day (BID) | ORAL | 2 refills | Status: DC | PRN

## 2016-09-04 NOTE — Progress Notes (Signed)
Subjective:    Patient ID: Kylie Allen, female    DOB: September 01, 1923, 83 y.o.   MRN: [id number] Chief Complaint  Patient presents with  . Follow-up    labs    HPI  Kylie Allen is in independent living. She doesn't have the energy to use a medication box.  Appetite good, sleeping well (compalinig to daughter that she doesn't sleep.).    Kylie Allen is a 83 yo woman here for follow-up of her chronic medical conditions and medication refill.  She has a history of poor compliance. She has been in independent living for >10 years though family recently questions whether she needs to move to assisted living.  Anxiety: she has been on lorazepam 0.57m bid for a large # of years and becomes very agitated when she runs out.  I stopped refilling her prescription for lorazepam as it has been >9 mos since her last OV And she continually failed to make an appointment despite our request for this with each lorazepam refill when it was over 6 months past her prior visit. When she ran out she became very agitated and required evaluation in the emergency room.  Chronic kidney disease stage III: Last visit with nephrology was 05/2014. Seen by Dr. WJustin Mend Was noted to have secondary hyperparathyroidism along with anemia from CKD due to nephrosclerosis. GFR 2 wks prior was 40  Hypertension: On hctz 268mand K 1058m amlodipine 5mg13mNo blood pressure check outside the office though she has a machine at home. She hasn't been taking her hctz today - states she is lazy.   H/o bradycardia with PVC's and occ SVT: Echocardiogram done 01/2014 showed EF 55-60%, increased artery pulmonary pressure 54 mmgHg. moderate tricuspid regurg. Mild AI.  Last seen by ScotRichardson Dopp6.  Chronic left flank pain from post-herpetic neuralgia: On gabapentin 100mg73m and 300mg 42mfor years without adverse effects but questionable benefit. Did try the otc lidocaine patch but prefers to take a pill.  Medication is working well for her  now though.   States she is currently in pain for which she did take gabapentin but she has no idea how much - states 1 or 2 as needed.   Dementia: mild but pt with complete denial.  Would like further eval with MMSE.  GERD: on ppi. Does get indigestion and she threw up.  urine nml, no incontinence issues bowels nml   Depression screen PHQ 2/[facility]/19/2018 09/04/2016 11/10/2015 04/27/2015 11/11/2014  Decreased Interest 0 0 0 0 3  Down, Depressed, Hopeless 0 0 0 1 3  PHQ - 2 Score 0 0 0 1 6  Altered sleeping - - - - 1  Tired, decreased energy - - - - 3  Change in appetite - - - - 1  Feeling bad or failure about yourself  - - - - 0  Trouble concentrating - - - - 0  Moving slowly or fidgety/restless - - - - 1  Suicidal thoughts - - - - 0  PHQ-9 Score - - - - 12  Difficult doing work/chores - - - - Somewhat difficult   Past Medical History:  Diagnosis Date  . Bradycardia   . Breast cancer (HCC)    hx of  . CKD (chronic kidney disease)   . Depression with anxiety   . Diverticulosis    hx of  . Fatigue   . GERD (gastroesophageal reflux disease)   . H/O: hysterectomy   . History of echocardiogram  Echo (8/15): EF 55-60%, normal wall motion, grade 1 diastolic dysfunction, mild AI, moderate TR, PASP 54 mm Hg  . Hyperlipidemia   . Hypertension    Past Surgical History:  Procedure Laterality Date  . MASTECTOMY Right    Current Outpatient Prescriptions on File Prior to Visit  Medication Sig Dispense Refill  . amLODipine (NORVASC) 5 MG tablet Take 1 tablet (5 mg total) by mouth daily. 30 tablet 2  . aspirin 325 MG tablet Take 325 mg by mouth as needed.     . gabapentin (NEURONTIN) 100 MG capsule TAKE 1 CAPSULE BY MOUTH 3 TIMES DAILY AS NEEDED FOR post SHINGLES PAIN AND 3 CAPSULES EVERY EVENING 180 capsule 1  . hydrochlorothiazide (HYDRODIURIL) 25 MG tablet Take 1 tablet (25 mg total) by mouth daily. 30 tablet 2  . lidocaine (LIDODERM) 5 % Place 1 patch onto the skin daily. Remove &  Discard patch within 12 hours or as directed by MD 30 patch 5  . LORazepam (ATIVAN) 0.5 MG tablet Take 0.5-1 tablets (0.25-0.5 mg total) by mouth 2 (two) times daily as needed for anxiety. 60 tablet 2  . Multiple Vitamin (MULTIVITAMIN WITH MINERALS) TABS tablet Take 1 tablet by mouth daily.    . potassium chloride (K-DUR) 10 MEQ tablet Take 1 tablet (10 mEq total) by mouth daily. 30 tablet 2   No current facility-administered medications on file prior to visit.    No Known Allergies Family History  Problem Relation Age of Onset  . Coronary artery disease Mother     family hx of  . Hypertension Mother     hx of  . Stroke Sister   . Heart attack Neg Hx    Social History   Social History  . Marital status: Widowed    Spouse name: N/A  . Number of children: N/A  . Years of education: N/A   Social History Main Topics  . Smoking status: Former Research scientist (life sciences)  . Smokeless tobacco: Never Used  . Alcohol use No  . Drug use: Unknown  . Sexual activity: Not Asked   Other Topics Concern  . None   Social History Narrative  . None    Review of Systems  Constitutional: Positive for fatigue. Negative for activity change, appetite change, chills, diaphoresis and fever.  Gastrointestinal: Negative for abdominal pain, constipation, diarrhea, nausea and vomiting.  Genitourinary: Negative for decreased urine volume, difficulty urinating, dysuria, enuresis, frequency and urgency.  Musculoskeletal: Positive for arthralgias, back pain and myalgias. Negative for gait problem and joint swelling.  Psychiatric/Behavioral: Positive for confusion and sleep disturbance. Negative for agitation, behavioral problems and dysphoric mood.   See hpi    Objective:   Physical Exam  Constitutional: She is oriented to person, place, and time. She appears well-developed and well-nourished. No distress.  HENT:  Head: Normocephalic and atraumatic.  Right Ear: External ear normal.  Left Ear: External ear normal.    Eyes: Conjunctivae are normal. No scleral icterus.  Neck: Normal range of motion. Neck supple. No thyromegaly present.  Cardiovascular: Normal rate and intact distal pulses.  An irregularly irregular rhythm present.  Murmur heard.  Decrescendo systolic murmur is present with a grade of 3/6  Pulmonary/Chest: Effort normal and breath sounds normal. No respiratory distress.  Musculoskeletal: She exhibits no edema.  Lymphadenopathy:    She has no cervical adenopathy.  Neurological: She is alert and oriented to person, place, and time.  Skin: Skin is warm and dry. She is not diaphoretic. No erythema.  Psychiatric: She has a normal mood and affect. Her speech is normal. She is agitated. She exhibits abnormal recent memory.    BP (!) 164/60   Pulse 97   Temp 99 F (37.2 C) (Oral)   Resp 16   Ht 5' (1.524 m)   Wt 129 lb (58.5 kg)   SpO2 94%   BMI 25.19 kg/m     MMSE 25/30 Assessment & Plan:  Keep meds 1 month at a time. - ok to refill all medicines x 6 mos when requested (in ativan; will need refills for all in 2 mos.) Recheck with me in 6 mos for AWV.  1. Essential hypertension - strongly encouraged med compliance - pt did not take bp meds today and takes about half of time as on some days just to tired - gave option of trying to come off meds but pt thinks she can increase her compliance and use her med box.  Cont hctz 25 w/ K 20 and amlodipine 10  2. Gastroesophageal reflux disease, esophagitis presence not specified - no sig sxs, no longer taking ppi - advised to stay off and use h2 blocker prn instead  3. Dementia without behavioral disturbance, unspecified dementia type - seems average for a 83 yo but pt very defensive and easily upset when discussing. Did amazing on mmse at 19/30 - picture in media tab  4. Stage 3 chronic kidney disease - stable, encouraged bp control, eGFR was 40 on labs 2 wks ago. Stay off ppi, no nsaids  5. PVC's (premature ventricular contractions)   6.  Postherpetic neuralgia - prn gabapentin continued  7. Chronic pain syndrome   8. Insomnia, unspecified type   9. Anxiety - stable on ativan 0.41m bid - #60 usually last 35-40d per [place] fills. Has been on this for a LONG time and this is the only thing she takes with any regularity - when she ran out severe w/d sxs. Discussed w/ pt and her daughter that pt needs to be seen every 6 mos for refills.    Meds ordered this encounter  Medications  . ranitidine (ZANTAC) 150 MG tablet    Sig: Take 1 tablet (150 mg total) by mouth 2 (two) times daily as needed for heartburn.    Dispense:  60 tablet    Refill:  2     EDelman Cheadle M.D.  [facility]GSc[address] [place] [phone]  09/04/16 6:53 PM

## 2016-09-04 NOTE — Patient Instructions (Addendum)
IF you received an x-ray today, you will receive an invoice from [facility] Radiology. Please contact [facility] at [phone] with questions or concerns regarding your invoice.   IF you received labwork today, you will receive an invoice from [place]. Please contact [facility] at [phone] with questions or concerns regarding your invoice.   Our billing staff will not be able to assist you with questions regarding bills from these companies.  You will be contacted with the lab results as soon as they are available. The fastest way to get your results is to activate your My Chart account. Instructions are located on the last page of this paperwork. If you have not heard from [place] regarding the results in 2 weeks, please contact this office.     Managing Your Hypertension Hypertension is commonly called high blood pressure. This is when the force of your blood pressing against the walls of your arteries is too strong. Arteries are blood vessels that carry blood from your heart throughout your body. Hypertension forces the heart to work harder to pump blood, and may cause the arteries to become narrow or stiff. Having untreated or uncontrolled hypertension can cause heart attack, stroke, kidney disease, and other problems. What are blood pressure readings? A blood pressure reading consists of a higher number over a lower number. Ideally, your blood pressure should be below 120/80. The first ("top") number is called the systolic pressure. It is a measure of the pressure in your arteries as your heart beats. The second ("bottom") number is called the diastolic pressure. It is a measure of the pressure in your arteries as the heart relaxes. What does my blood pressure reading mean? Blood pressure is classified into four stages. Based on your blood pressure reading, your health care provider may use the following stages to determine what type of treatment you need, if any. Systolic  pressure and diastolic pressure are measured in a unit called mm Hg. Normal   Systolic pressure: below 833.  Diastolic pressure: below 80. Elevated   Systolic pressure: 825-053.  Diastolic pressure: below 80. Hypertension stage 1     Diastolic pressure: 97-67. Hypertension stage 2   Systolic pressure: 341 or above.  Diastolic pressure: 90 or above. What health risks are associated with hypertension? Managing your hypertension is an important responsibility. Uncontrolled hypertension can lead to:  A heart attack.  A stroke.  A weakened blood vessel (aneurysm).  Heart failure.  Kidney damage.  Eye damage.  Metabolic syndrome.  Memory and concentration problems. What changes can I make to manage my hypertension? Eating and drinking   Eat a diet that is high in fiber and potassium, and low in salt (sodium), added sugar, and fat. An example eating plan is called the DASH (Dietary Approaches to Stop Hypertension) diet. To eat this way:  Eat plenty of fresh fruits and vegetables. Try to fill half of your plate at each meal with fruits and vegetables.  Eat whole grains, such as whole wheat pasta, brown rice, or whole grain bread. Fill about one quarter of your plate with whole grains.  Eat low-fat diary products.  Avoid fatty cuts of meat, processed or cured meats, and poultry with skin. Fill about one quarter of your plate with lean proteins such as fish, chicken without skin, beans, eggs, and tofu.  Avoid premade and processed foods. These tend to be higher in sodium, added sugar, and fat.     Lifestyle   Work with your health care  provider to maintain a healthy body weight, or to lose weight. Ask what an ideal weight is for you.  Get at least 30 minutes of exercise that causes your heart to beat faster (aerobic exercise) most days of the week. Activities may include walking, swimming, or biking.       Monitoring   Monitor your blood pressure at home as  told by your health care provider. Your personal target blood pressure may vary depending on your medical conditions, your age, and other factors.  Have your blood pressure checked regularly, as often as told by your health care provider. Working with your health care provider   Review all the medicines you take with your health care provider because there may be side effects or interactions.  Talk with your health care provider about your diet, exercise habits, and other lifestyle factors that may be contributing to hypertension.  Visit your health care provider regularly. Your health care provider can help you create and adjust your plan for managing hypertension. Will I need medicine to control my blood pressure? Your health care provider may prescribe medicine if lifestyle changes are not enough to get your blood pressure under control, and if:  Your systolic blood pressure is 130 or higher.  Your diastolic blood pressure is 80 or higher. Take medicines only as told by your health care provider. Follow the directions carefully. Blood pressure medicines must be taken as prescribed. The medicine does not work as well when you skip doses. Skipping doses also puts you at risk for problems. Contact a health care provider if:  You think you are having a reaction to medicines you have taken.  You have repeated (recurrent) headaches.  You feel dizzy.  You have swelling in your ankles.  You have trouble with your vision. Get help right away if:  You develop a severe headache or confusion.  You have unusual weakness or numbness, or you feel faint.  You have severe pain in your chest or abdomen.  You vomit repeatedly.  You have trouble breathing. Summary  Hypertension is when the force of blood pumping through your arteries is too strong. If this condition is not controlled, it may put you at risk for serious complications.  Your personal target blood pressure may vary depending  on your medical conditions, your age, and other factors. For most people, a normal blood pressure is less than 120/80.  Hypertension is managed by lifestyle changes, medicines, or both. Lifestyle changes include weight loss, eating a healthy, low-sodium diet, exercising more, and limiting alcohol. This information is not intended to replace advice given to you by your health care provider. Make sure you discuss any questions you have with your health care provider. Document Released: 02/28/2012 Document Revised: 05/03/2016 Document Reviewed: 05/03/2016 Elsevier Interactive Patient Education  2017 [employer].

## 2016-09-04 NOTE — Progress Notes (Deleted)
   Subjective:    Patient ID: Kylie Allen, female    DOB: Jul 31, 1923, 83 y.o.   MRN: [id number]  HPI  Kylie Allen is a 83 yo woman here for follow-up of her chronic medical conditions and medication refill.  She has a history of poor compliance. She has been in independent living for >10 years though family recently questions whether she needs to move to assisted living.  Anxiety: she has been on lorazepam 0.5mg  bid for a large # of years and becomes very agitated when she runs out.  I stopped refilling her prescription for lorazepam as it has been >9 mos since her last OV And she continually failed to make an appointment despite our request for this with each lorazepam refill when it was over 6 months past her prior visit. When she ran out she became very agitated and required evaluation in the emergency room.  Chronic kidney disease stage III: Last visit with nephrology was 05/2014. Seen by Dr. Justin Allen. Was noted to have secondary hyperparathyroidism along with anemia from CKD due to nephrosclerosis. GFR 2 wks prior was 40  Hypertension: On hctz 25mg  and K 26meq, amlodipine 5mg   H/o bradycardia with PVC's and occ SVT: Echocardiogram done 01/2014 showed EF 55-60%, increased artery pulmonary pressure 54 mmgHg. moderate tricuspid regurg. Mild AI.  Last seen by Kylie Allen 2016.  Chronic left flank pain from post-herpetic neuralgia: On gabapentin 100mg  tid and 300mg  qhs for years without adverse effects but questionable benefit. Tried otc lidocaine patch?  Dementia: mild but pt with complete denial.  Would like pt to RTC for further eval with MMSE.  GERD: on ppi  Review of Systems     Objective:   Physical Exam        Assessment & Plan:  Cont K - last was slightly low at 3.4 ua, cbc?

## 2016-09-04 NOTE — Telephone Encounter (Signed)
This patient no-showed her scheduled appointment with me. Below and notes handmade in preparation for her visit.  [name] is a 83 yo woman here for follow-up of her chronic medical conditions and medication refill.  She has a history of poor compliance. She has been in independent living for >10 years though family recently questions whether she needs to move to assisted living.  Anxiety: she has been on lorazepam 0.5mg  bid for a large # of years and becomes very agitated when she runs out.  I stopped refilling her prescription for lorazepam as it has been >9 mos since her last OV And she continually failed to make an appointment despite our request for this with each lorazepam refill when it was over 6 months past her prior visit. When she ran out she became very agitated and required evaluation in the emergency room.  Chronic kidney disease stage III: Last visit with nephrology was 05/2014. Seen by Dr. [name]. Was noted to have secondary hyperparathyroidism along with anemia from CKD due to nephrosclerosis. GFR 2 wks prior was 40  Hypertension: On hctz 25mg  and K 2meq, amlodipine 5mg   H/o bradycardia with PVC's and occ SVT: Echocardiogram done 01/2014 showed EF 55-60%, increased artery pulmonary pressure 54 mmgHg. moderate tricuspid regurg. Mild AI.  Last seen by Richardson Dopp 2016.  Chronic left flank pain from post-herpetic neuralgia: On gabapentin 100mg  tid and 300mg  qhs for years without adverse effects but questionable benefit. Tried otc lidocaine patch?  Dementia: mild but pt with complete denial.  Would like pt to RTC for further eval with MMSE.  GERD: on ppi  ASSESSMENT AND PLAN: Cont K - last was slightly low at 3.4 ua, cbc?

## 2016-10-02 ENCOUNTER — Other Ambulatory Visit: Payer: Self-pay | Admitting: Family Medicine

## 2016-11-03 ENCOUNTER — Other Ambulatory Visit: Payer: Self-pay | Admitting: Family Medicine

## 2016-11-06 NOTE — Telephone Encounter (Signed)
[facility]

## 2017-01-17 ENCOUNTER — Other Ambulatory Visit: Payer: Self-pay | Admitting: Family Medicine

## 2017-01-19 ENCOUNTER — Telehealth: Payer: Self-pay | Admitting: Family Medicine

## 2017-01-19 NOTE — Telephone Encounter (Signed)
Colin Rhein called and stated that pt needs a refill on LORazepam (ATIVAN) 0.5 MG tablet [[id number]]..  Please advise: [phone]

## 2017-01-19 NOTE — Telephone Encounter (Signed)
5 mo supply given on 5/19

## 2017-01-20 ENCOUNTER — Ambulatory Visit: Payer: Medicare Other | Admitting: Family Medicine

## 2017-01-22 ENCOUNTER — Ambulatory Visit (INDEPENDENT_AMBULATORY_CARE_PROVIDER_SITE_OTHER): Payer: Medicare Other | Admitting: Family Medicine

## 2017-01-22 ENCOUNTER — Encounter: Payer: Self-pay | Admitting: Family Medicine

## 2017-01-22 VITALS — BP 153/53 | HR 93 | Temp 97.9°F | Resp 18 | Ht 60.0 in | Wt 122.4 lb

## 2017-01-22 DIAGNOSIS — R634 Abnormal weight loss: Secondary | ICD-10-CM | POA: Diagnosis not present

## 2017-01-22 DIAGNOSIS — Z87448 Personal history of other diseases of urinary system: Secondary | ICD-10-CM | POA: Diagnosis not present

## 2017-01-22 DIAGNOSIS — I10 Essential (primary) hypertension: Secondary | ICD-10-CM

## 2017-01-22 DIAGNOSIS — R109 Unspecified abdominal pain: Secondary | ICD-10-CM | POA: Diagnosis not present

## 2017-01-22 DIAGNOSIS — F411 Generalized anxiety disorder: Secondary | ICD-10-CM

## 2017-01-22 DIAGNOSIS — F0390 Unspecified dementia without behavioral disturbance: Secondary | ICD-10-CM | POA: Diagnosis not present

## 2017-01-22 DIAGNOSIS — G8929 Other chronic pain: Secondary | ICD-10-CM

## 2017-01-22 DIAGNOSIS — N183 Chronic kidney disease, stage 3 unspecified: Secondary | ICD-10-CM

## 2017-01-22 LAB — POC MICROSCOPIC URINALYSIS (UMFC): MUCUS RE: ABSENT

## 2017-01-22 LAB — POCT URINALYSIS DIP (MANUAL ENTRY)
GLUCOSE UA: NEGATIVE mg/dL
NITRITE UA: NEGATIVE
Protein Ur, POC: 30 mg/dL — AB
Spec Grav, UA: 1.02 (ref 1.010–1.025)
Urobilinogen, UA: 1 E.U./dL
pH, UA: 5.5 (ref 5.0–8.0)

## 2017-01-22 MED ORDER — HYDROCHLOROTHIAZIDE 25 MG PO TABS: 25.0000 mg | ORAL_TABLET | Freq: Every day | ORAL | 5 refills | Status: DC

## 2017-01-22 MED ORDER — GABAPENTIN 100 MG PO CAPS: ORAL_CAPSULE | ORAL | 3 refills | Status: DC

## 2017-01-22 MED ORDER — RANITIDINE HCL 150 MG PO TABS: 150.0000 mg | ORAL_TABLET | Freq: Two times a day (BID) | ORAL | 5 refills | Status: DC | PRN

## 2017-01-22 MED ORDER — AMLODIPINE BESYLATE 5 MG PO TABS: 5.0000 mg | ORAL_TABLET | Freq: Every day | ORAL | 5 refills | Status: DC

## 2017-01-22 MED ORDER — LORAZEPAM 0.5 MG PO TABS: ORAL_TABLET | ORAL | 4 refills | Status: DC

## 2017-01-22 MED ORDER — POTASSIUM CHLORIDE ER 10 MEQ PO TBCR: 10.0000 meq | EXTENDED_RELEASE_TABLET | Freq: Every day | ORAL | 5 refills | Status: DC

## 2017-01-22 NOTE — Progress Notes (Signed)
Subjective:    Patient ID: Kylie Allen, female    DOB: [birth year], 83 y.o.   MRN: [id number] Chief Complaint  Patient presents with  . Medication Refill    HCTZ, amlodipine, ativan     HPI  Kylie Allen is as delightful 83 yo woman who is here for a refill on her chronic medications. She is again here today with her daughter Kylie Allen who is her main caretaker and has accompanied pt to her last several visits as well.  I last saw her 4-1/2 months prior.  She has a history of poor compliance which is unintentional due to memory, age, and relatively good health. She has been in independent living for >10 years though family recently questions whether she needs to move to assisted living and pt repots that she is planning to make this move soon.  She doesn't have the energy to use a medication box.  Appetite good, sleeping well (though daughter states she often complains that she doesn't sleep.).    Anxiety: she has been on lorazepam 0.27m bid for a large # of years and becomes very agitated when she runs out to the point of requiring evaluation in the emergency room this past spring. I sent a 5 month supply on 11/04/2016. It shows that a prescription written by me on 11/06/2016 was filled on 12/15/2016 and did not have any refills. [facility].   CKD stage III: Last visit with nephrology was 05/2014. Seen by Dr. WJustin Mend Was noted to have secondary hyperparathyroidism along with anemia from CKD due to nephrosclerosis. eGFR 40 with baseline Cr about 1.4  HTN: On hctz 260mand K 1041m amlodipine 5mg32mNo blood pressure checks outside the office (gave her BP machine to her daughter).  H/o bradycardia with PVC's and occ SVT: Echocardiogram done 01/2014 showed EF 55-60%, increased artery pulmonary pressure 54 mmgHg. moderate tricuspid regurg. Mild AI. Last seen by ScotRichardson Dopp6.  Chronic left flank pain from post-herpetic neuralgia: On gabapentin 100mg31m and  300mg 33mfor years without adverse effects. Did try the otc lidocaine patch but prefers to take a pill.  States she has no idea how much gabapentin she usually takes - states 1 or 2 as needed. She always tends to complain of left flank pain but perhaps recent complaints have become more frequent.  Dementia: mild but pt with complete denial. Consider further eval with MMSE whenever time at visit allows.  Pt unwilling ot make return trip for this.  GERD: on ppi. Does get indigestion even to the point of emesis when goes off it but ppi controls sxs urine nml, no incontinence issues bowels nml   Unintentional weight loss: Loosing weight and is not sure why.  She states her appetite is a little less for unknown reasons but denies any other associated symptoms or alarm symptoms at all. Does complain of the left flank pain but this is her only other note. Bowels and urine are normal. Appetite is variable but snacking frequently and drinking ensures some though would like to do this more. States she prefers desserts and sugary foods, especially ice cream. She is adamant against the suggestion of Meals on Wheels as she states she doesn't like the food. Adamant that she has plenty of food to eat when her daughter brings it though she doesn't cook at all.  Past Medical History:  Diagnosis Date  . Bradycardia   . Breast cancer (HCC)[place]  hx of  . CKD (chronic kidney disease)   . Depression with anxiety   . Diverticulosis    hx of  . Fatigue   . GERD (gastroesophageal reflux disease)   . H/O: hysterectomy   . History of echocardiogram    Echo (8/15): EF 55-60%, normal wall motion, grade 1 diastolic dysfunction, mild AI, moderate TR, PASP 54 mm Hg  . Hyperlipidemia   . Hypertension    Past Surgical History:  Procedure Laterality Date  . MASTECTOMY Right    Current Outpatient Prescriptions on File Prior to Visit  Medication Sig Dispense Refill  . aspirin 325 MG tablet Take 325 mg by mouth as  needed.     . lidocaine (LIDODERM) 5 % Place 1 patch onto the skin daily. Remove & Discard patch within 12 hours or as directed by MD 30 patch 5  . Multiple Vitamin (MULTIVITAMIN WITH MINERALS) TABS tablet Take 1 tablet by mouth daily.     No current facility-administered medications on file prior to visit.    No Known Allergies Family History  Problem Relation Age of Onset  . Coronary artery disease Mother        family hx of  . Hypertension Mother        hx of  . Stroke Sister   . Heart attack Neg Hx    Social History   Social History  . Marital status: Widowed    Spouse name: N/A  . Number of children: N/A  . Years of education: N/A   Social History Main Topics  . Smoking status: Former Research scientist (life sciences)  . Smokeless tobacco: Never Used  . Alcohol use No  . Drug use: Unknown  . Sexual activity: Not Asked   Other Topics Concern  . None   Social History Narrative  . None   Depression screen [facility] 2/9 01/22/2017 09/04/2016 09/04/2016 11/10/2015 04/27/2015  Decreased Interest 0 0 0 0 0  Down, Depressed, Hopeless 0 0 0 0 1  PHQ - 2 Score 0 0 0 0 1  Altered sleeping - - - - -  Tired, decreased energy - - - - -  Change in appetite - - - - -  Feeling bad or failure about yourself  - - - - -  Trouble concentrating - - - - -  Moving slowly or fidgety/restless - - - - -  Suicidal thoughts - - - - -  PHQ-9 Score - - - - -  Difficult doing work/chores - - - - -     Review of Systems  Constitutional: Positive for appetite change (slight decrease) and unexpected weight change. Negative for activity change, chills, diaphoresis, fatigue and fever.  Gastrointestinal: Negative for abdominal distention, abdominal pain, anal bleeding, blood in stool, constipation, diarrhea, nausea, rectal pain and vomiting.  Genitourinary: Positive for flank pain. Negative for decreased urine volume, difficulty urinating, dyspareunia, dysuria, enuresis, frequency, genital sores, hematuria, menstrual problem,  pelvic pain, urgency, vaginal bleeding, vaginal discharge and vaginal pain.  Musculoskeletal: Negative for gait problem.  Skin: Negative for rash.  Hematological: Negative for adenopathy.  Psychiatric/Behavioral: Positive for sleep disturbance. Negative for dysphoric mood. The patient is nervous/anxious.    See hpi    Objective:   Physical Exam  Constitutional: She is oriented to person, place, and time. She appears well-developed and well-nourished. No distress.  HENT:  Head: Normocephalic and atraumatic.  Right Ear: External ear normal.  Left Ear: External ear normal.  Eyes: Conjunctivae are normal. No scleral icterus.  Neck: Normal range of motion. Neck supple. No thyromegaly present.  Cardiovascular: Normal rate, regular rhythm and intact distal pulses.   Murmur heard.  Decrescendo systolic murmur is present with a grade of 2/6  Pulses:      Dorsalis pedis pulses are 2+ on the right side, and 2+ on the left side.  Pulmonary/Chest: Effort normal and breath sounds normal. No respiratory distress.  Abdominal: Soft. Bowel sounds are normal. She exhibits mass (? LUQ and LLQ stool??). She exhibits no distension and no ascites. There is no hepatosplenomegaly. There is generalized tenderness. There is CVA tenderness (Lt>Rt). No hernia.  Musculoskeletal: She exhibits no edema.  Lymphadenopathy:    She has no cervical adenopathy.  Neurological: She is alert and oriented to person, place, and time.  Skin: Skin is warm and dry. She is not diaphoretic. No erythema.  Psychiatric: She has a normal mood and affect. Her behavior is normal.    BP (!) 153/53   Pulse 93   Temp 97.9 F (36.6 C) (Oral)   Resp 18   Ht 5' (1.524 m)   Wt 122 lb 6.4 oz (55.5 kg)   SpO2 96%   BMI 23.90 kg/m      Results for orders placed or performed in visit on 01/22/17  Comprehensive metabolic panel  Result Value Ref Range   Glucose 102 (H) 65 - 99 mg/dL   BUN 21 10 - 36 mg/dL   Creatinine, Ser 1.42 (H)  0.57 - 1.00 mg/dL   GFR calc non Af Amer 33 (L) >59 mL/min/1.73   GFR calc Af Amer 38 (L) >59 mL/min/1.73   BUN/Creatinine Ratio 15 12 - 28   Sodium 146 (H) 134 - 144 mmol/L   Potassium 3.3 (L) 3.5 - 5.2 mmol/L   Chloride 102 96 - 106 mmol/L   CO2 24 20 - 29 mmol/L   Calcium 9.6 8.7 - 10.3 mg/dL   Total Protein 6.8 6.0 - 8.5 g/dL   Albumin 4.0 3.2 - 4.6 g/dL   Globulin, Total 2.8 1.5 - 4.5 g/dL   Albumin/Globulin Ratio 1.4 1.2 - 2.2   Bilirubin Total 0.2 0.0 - 1.2 mg/dL   Alkaline Phosphatase 68 39 - 117 IU/L   AST 15 0 - 40 IU/L   ALT 4 0 - 32 IU/L  TSH  Result Value Ref Range   TSH 1.310 0.450 - 4.500 uIU/mL  CBC with Differential/Platelet  Result Value Ref Range   WBC 8.4 3.4 - 10.8 x10E3/uL   RBC 3.88 3.77 - 5.28 x10E6/uL   Hemoglobin 11.8 11.1 - 15.9 g/dL   Hematocrit 36.7 34.0 - 46.6 %   MCV 95 79 - 97 fL   MCH 30.4 26.6 - 33.0 pg   MCHC 32.2 31.5 - 35.7 g/dL   RDW 14.2 12.3 - 15.4 %   Platelets 329 150 - 379 x10E3/uL   Neutrophils 78 Not Estab. %   Lymphs 15 Not Estab. %   Monocytes 6 Not Estab. %   Eos 0 Not Estab. %   Basos 0 Not Estab. %   Neutrophils Absolute 6.6 1.4 - 7.0 x10E3/uL   Lymphocytes Absolute 1.2 0.7 - 3.1 x10E3/uL   Monocytes Absolute 0.5 0.1 - 0.9 x10E3/uL   EOS (ABSOLUTE) 0.0 0.0 - 0.4 x10E3/uL   Basophils Absolute 0.0 0.0 - 0.2 x10E3/uL   Immature Granulocytes 1 Not Estab. %   Immature Grans (Abs) 0.0 0.0 - 0.1 x10E3/uL  POCT urinalysis dipstick  Result Value Ref Range  Color, UA straw (A) yellow   Clarity, UA clear clear   Glucose, UA negative negative mg/dL   Bilirubin, UA moderate (A) negative   Ketones, POC UA trace (5) (A) negative mg/dL   Spec Grav, UA 1.020 1.010 - 1.025   Blood, UA trace-intact (A) negative   pH, UA 5.5 5.0 - 8.0   Protein Ur, POC =30 (A) negative mg/dL   Urobilinogen, UA 1.0 0.2 or 1.0 E.U./dL   Nitrite, UA Negative Negative   Leukocytes, UA Small (1+) (A) Negative  POCT Microscopic Urinalysis ([facility])    Result Value Ref Range   WBC,UR,HPF,POC Moderate (A) None WBC/hpf   RBC,UR,HPF,POC None None RBC/hpf   Bacteria Many (A) None, Too numerous to count   Mucus Absent Absent   Epithelial Cells, UR Per Microscopy Few (A) None, Too numerous to count cells/hpf    Assessment & Plan:   1. Essential hypertension - BP at goal [id number] systolic for age. Cont current regimen  2. Dementia without behavioral disturbance, unspecified dementia type - consistent with age, poss some vascular disease. Does not seem to be progressing from prior encounters. Pt declines further eval for now.  3. Stage 3 chronic kidney disease - stable for > 5 yrs  4. Personal history of urinary disorder   5. Anxiety state - chronically dependent for > 10 yrs on lorazepam 0.5 bid - pharmacy got last rx with NO refills per [facility] though I had written it with 4 - pt using less lorazepam than prior - spacing out fills, pt proud of these efforts.  6. Unintentional weight loss - poss due to decreased appetite/taste with aging as pt denies any other alarm sxs - cont to monitor closely. Increase use of protein supp drinks.  7. Chronic left flank pain - longstanding h/o this from herpes zostor on gabapentin which I think she freq forgets to take but really unable to provide any indication of her use - asked to bring meds to f/u OV.  Reassuring that no hematuria - I think if there was any kidney conditions causing pain (mass, cyst, inflammation) there would surely be some hematuria so will hold off on further w /u for now but low threshold to further eval abd/kidneys - would likely start with imaging - retroperitoneal [place] vs abd/pelvic CT if sxs worsen or any sig abnml on below work up or if weight loss continues. Pt and daughter agree w/ plan.    Orders Placed This Encounter  Procedures  . Urine Culture  . Comprehensive metabolic panel  . TSH  . CBC with Differential/Platelet  . POCT urinalysis dipstick  . POCT Microscopic Urinalysis  ([facility])    Meds ordered this encounter  Medications  . LORazepam (ATIVAN) 0.5 MG tablet    Sig: TAKE 1/2-1 TABLET BY MOUTH 2 TIMES DAILY AS NEEDED FOR ANXIETY    Dispense:  60 tablet    Refill:  4  . amLODipine (NORVASC) 5 MG tablet    Sig: Take 1 tablet (5 mg total) by mouth daily.    Dispense:  30 tablet    Refill:  5  . gabapentin (NEURONTIN) 100 MG capsule    Sig: TAKE 1 CAPSULE BY MOUTH 3 TIMES DAILY AS NEEDED FOR post SHINGLES PAIN AND 3 CAPSULES EVERY EVENING    Dispense:  180 capsule    Refill:  3  . hydrochlorothiazide (HYDRODIURIL) 25 MG tablet    Sig: Take 1 tablet (25 mg total) by mouth daily.  Dispense:  30 tablet    Refill:  5  . potassium chloride (K-DUR) 10 MEQ tablet    Sig: Take 1 tablet (10 mEq total) by mouth daily. Along with the HCTZ    Dispense:  30 tablet    Refill:  5  . ranitidine (ZANTAC) 150 MG tablet    Sig: Take 1 tablet (150 mg total) by mouth 2 (two) times daily as needed for heartburn.    Dispense:  60 tablet    Refill:  5   Today I have utilized the [place] Controlled Substance Registry's online query to confirm compliance regarding the patient's narcotic pain medications. My review reveals appropriate prescription fills and that [facility] is the sole provider of these medications. Rechecks will occur regularly and the patient is aware of our use of the system.   [name], M.D.  Primary Care at [facility] [address] [place], [place] [address] [phone] phone [phone] fax  01/23/17 5:30 PM

## 2017-01-22 NOTE — Patient Instructions (Signed)
If there is any blood in the urine but there is no infection, we'll send you for an ultrasound of your kidney. If your urine is fine with no blood in it, then we can likely attribute this to the chronic pain from the shingles infection that you had years ago.  If it is worsening at all, we can start a nightly medication to decrease the pain and we should still send you for an ultrasound or CT scan to ensure nothing else is going on.  Make sure you are drinking ensure or boost protein supplements if you are skipping meals. If you are continuing to lose weight, please come back in for further evaluation so that we can ensure this is not a symptom of your bodies distress or in underlying illness/disease process.  Postherpetic Neuralgia Postherpetic neuralgia (PHN) is nerve pain that occurs after a shingles infection. Shingles is a painful rash that appears on one side of the body, usually on your trunk or face. Shingles is caused by the varicella-zoster virus. This is the same virus that causes chickenpox. In people who have had chickenpox, the virus can resurface years later and cause shingles. You may have PHN if you continue to have pain for 3 months after your shingles rash has gone away. PHN appears in the same area where you had the shingles rash. For most people, PHN goes away within 1 year. Getting a vaccination for shingles can prevent PHN. This vaccine is recommended for people older than 50. It may prevent shingles and may also lower your risk of PHN if you do get shingles. What are the causes? PHN is caused by damage to your nerves from the varicella-zoster virus. This damage makes your nerves overly sensitive. What increases the risk? Aging is the biggest risk factor for developing PHN. Most people who get PHN are older than 34. Other risk factors include:  Having very bad pain before your shingles rash starts.  Having a very bad rash.  Having shingles in the nerve that supplies your face  and eye (trigeminal nerve).  What are the signs or symptoms? Pain is the main symptom of PHN. The pain is often very bad and may be described as stabbing, burning, or feeling like an electric shock. The pain may come and go or may be there all the time. Pain may be triggered by light touches on the skin or changes in temperature. You may have itching along with the pain. How is this diagnosed? Your health care provider may diagnose PHN based on your symptoms and your history of shingles. Lab studies and other diagnostic tests are usually not needed. How is this treated? There is no cure for PHN. Treatment for PHN will focus on pain relief. Over-the-counter pain relievers do not usually relieve PHN pain. You may need to work with a pain specialist. Treatment may include:  Antidepressant medicines to help with pain and improve sleep.  Antiseizure medicines to relieve nerve pain.  Strong pain relievers (opioids).  A numbing patch worn on the skin (lidocaine patch).  Follow these instructions at home: It may take a long time to recover from PHN. Work closely with your health care provider, and have a good support system at home.  Take all medicines as directed by your health care provider.  Wear loose, comfortable clothing.  Cover sensitive areas with a dressing to reduce friction from clothing rubbing on the area.  If cold does not make your pain worse, try applying a cool  compress or cooling gel pack to the area.  Talk to your health care provider if you feel depressed or desperate. Living with long-term pain can be depressing.  Contact a health care provider if:  Your medicine is not helping.  You are struggling to manage your pain at home. This information is not intended to replace advice given to you by your health care provider. Make sure you discuss any questions you have with your health care provider. Document Released: 08/26/2002 Document Revised: 11/11/2015 Document  Reviewed: 05/27/2013 Elsevier Interactive Patient Education  2018 [employer].  Flank Pain, Adult Flank pain is pain that is located on the side of the body between the upper abdomen and the back. This area is called the flank. The pain may occur over a short period of time (acute), or it may be long-term or recurring (chronic). It may be mild or severe. Flank pain can be caused by many things, including:  Muscle soreness or injury.  Kidney stones or kidney disease.  Stress.  A disease of the spine (vertebral disk disease).  A lung infection (pneumonia).  Fluid around the lungs (pulmonary edema).  A skin rash caused by the chickenpox virus (shingles).  Tumors that affect the back of the abdomen.  Gallbladder disease.  Follow these instructions at home:  Drink enough fluid to keep your urine clear or pale yellow.  Rest as told by your health care provider.  Take over-the-counter and prescription medicines only as told by your health care provider.  Keep a journal to track what has caused your flank pain and what has made it feel better.  Keep all follow-up visits as told by your health care provider. This is important. Contact a health care provider if:  Your pain is not controlled with medicine.  You have new symptoms.  Your pain gets worse.  You have a fever.  Your symptoms last longer than 2-3 days.  You have trouble urinating or you are urinating very frequently. Get help right away if:  You have trouble breathing or you are short of breath.  Your abdomen hurts or it is swollen or red.  You have nausea or vomiting.  You feel faint or you pass out.  You have blood in your urine. Summary  Flank pain is pain that is located on the side of the body between the upper abdomen and the back.  The pain may occur over a short period of time (acute), or it may be long-term or recurring (chronic). It may be mild or severe.  Flank pain can be caused by many  things.  Contact your health care provider if your symptoms get worse or they last longer than 2-3 days. This information is not intended to replace advice given to you by your health care provider. Make sure you discuss any questions you have with your health care provider. Document Released: 07/27/2005 Document Revised: 08/18/2016 Document Reviewed: 08/18/2016 Elsevier Interactive Patient Education  2018 [employer].

## 2017-01-22 NOTE — Telephone Encounter (Signed)
Pt was seen today and medication was refilled

## 2017-01-23 LAB — CBC WITH DIFFERENTIAL/PLATELET
BASOS ABS: 0 10*3/uL (ref 0.0–0.2)
Basos: 0 %
EOS (ABSOLUTE): 0 10*3/uL (ref 0.0–0.4)
EOS: 0 %
HEMATOCRIT: 36.7 % (ref 34.0–46.6)
HEMOGLOBIN: 11.8 g/dL (ref 11.1–15.9)
IMMATURE GRANS (ABS): 0 10*3/uL (ref 0.0–0.1)
IMMATURE GRANULOCYTES: 1 %
LYMPHS ABS: 1.2 10*3/uL (ref 0.7–3.1)
LYMPHS: 15 %
MCH: 30.4 pg (ref 26.6–33.0)
MCHC: 32.2 g/dL (ref 31.5–35.7)
MCV: 95 fL (ref 79–97)
MONOCYTES: 6 %
Monocytes Absolute: 0.5 10*3/uL (ref 0.1–0.9)
NEUTROS PCT: 78 %
Neutrophils Absolute: 6.6 10*3/uL (ref 1.4–7.0)
Platelets: 329 10*3/uL (ref 150–379)
RBC: 3.88 x10E6/uL (ref 3.77–5.28)
RDW: 14.2 % (ref 12.3–15.4)
WBC: 8.4 10*3/uL (ref 3.4–10.8)

## 2017-01-23 LAB — COMPREHENSIVE METABOLIC PANEL
A/G RATIO: 1.4 (ref 1.2–2.2)
ALT: 4 IU/L (ref 0–32)
AST: 15 IU/L (ref 0–40)
Albumin: 4 g/dL (ref 3.2–4.6)
Alkaline Phosphatase: 68 IU/L (ref 39–117)
BUN/Creatinine Ratio: 15 (ref 12–28)
BUN: 21 mg/dL (ref 10–36)
Bilirubin Total: 0.2 mg/dL (ref 0.0–1.2)
CALCIUM: 9.6 mg/dL (ref 8.7–10.3)
CO2: 24 mmol/L (ref 20–29)
CREATININE: 1.42 mg/dL — AB (ref 0.57–1.00)
Chloride: 102 mmol/L (ref 96–106)
GFR, EST AFRICAN AMERICAN: 38 mL/min/{1.73_m2} — AB (ref >59)
GFR, EST NON AFRICAN AMERICAN: 33 mL/min/{1.73_m2} — AB (ref >59)
GLUCOSE: 102 mg/dL — AB (ref 65–99)
Globulin, Total: 2.8 g/dL (ref 1.5–4.5)
POTASSIUM: 3.3 mmol/L — AB (ref 3.5–5.2)
Sodium: 146 mmol/L — ABNORMAL HIGH (ref 134–144)
Total Protein: 6.8 g/dL (ref 6.0–8.5)

## 2017-01-23 LAB — URINE CULTURE

## 2017-01-23 LAB — TSH: TSH: 1.31 u[IU]/mL (ref 0.450–4.500)

## 2017-02-26 ENCOUNTER — Other Ambulatory Visit: Payer: Self-pay | Admitting: Family Medicine

## 2017-03-05 ENCOUNTER — Encounter: Payer: Medicare Other | Admitting: Family Medicine

## 2017-06-07 ENCOUNTER — Other Ambulatory Visit: Payer: Self-pay

## 2017-06-07 ENCOUNTER — Encounter: Payer: Self-pay | Admitting: Family Medicine

## 2017-06-07 ENCOUNTER — Ambulatory Visit (INDEPENDENT_AMBULATORY_CARE_PROVIDER_SITE_OTHER): Payer: Medicare Other | Admitting: Family Medicine

## 2017-06-07 VITALS — BP 128/50 | HR 87 | Temp 98.1°F | Resp 16 | Ht 60.0 in | Wt 117.0 lb

## 2017-06-07 DIAGNOSIS — E860 Dehydration: Secondary | ICD-10-CM

## 2017-06-07 DIAGNOSIS — K449 Diaphragmatic hernia without obstruction or gangrene: Secondary | ICD-10-CM

## 2017-06-07 DIAGNOSIS — R531 Weakness: Secondary | ICD-10-CM

## 2017-06-07 DIAGNOSIS — M21619 Bunion of unspecified foot: Secondary | ICD-10-CM

## 2017-06-07 DIAGNOSIS — M25579 Pain in unspecified ankle and joints of unspecified foot: Secondary | ICD-10-CM

## 2017-06-07 DIAGNOSIS — I10 Essential (primary) hypertension: Secondary | ICD-10-CM | POA: Diagnosis not present

## 2017-06-07 DIAGNOSIS — R0789 Other chest pain: Secondary | ICD-10-CM | POA: Diagnosis not present

## 2017-06-07 DIAGNOSIS — R1114 Bilious vomiting: Secondary | ICD-10-CM

## 2017-06-07 DIAGNOSIS — N183 Chronic kidney disease, stage 3 unspecified: Secondary | ICD-10-CM

## 2017-06-07 DIAGNOSIS — F0391 Unspecified dementia with behavioral disturbance: Secondary | ICD-10-CM

## 2017-06-07 DIAGNOSIS — E876 Hypokalemia: Secondary | ICD-10-CM

## 2017-06-07 DIAGNOSIS — Z23 Encounter for immunization: Secondary | ICD-10-CM

## 2017-06-07 DIAGNOSIS — F411 Generalized anxiety disorder: Secondary | ICD-10-CM

## 2017-06-07 DIAGNOSIS — L84 Corns and callosities: Secondary | ICD-10-CM

## 2017-06-07 LAB — POC MICROSCOPIC URINALYSIS (UMFC): Mucus: ABSENT

## 2017-06-07 LAB — POCT CBC
GRANULOCYTE PERCENT: 72.3 % (ref 37–80)
HCT, POC: 38.1 % (ref 37.7–47.9)
HEMOGLOBIN: 12 g/dL — AB (ref 12.2–16.2)
Lymph, poc: 1.9 (ref 0.6–3.4)
MCH: 30.4 pg (ref 27–31.2)
MCHC: 31.5 g/dL — AB (ref 31.8–35.4)
MCV: 96.3 fL (ref 80–97)
MID (cbc): 0.3 (ref 0–0.9)
MPV: 8.4 fL (ref 0–99.8)
PLATELET COUNT, POC: 292 10*3/uL (ref 142–424)
POC Granulocyte: 5.7 (ref 2–6.9)
POC LYMPH PERCENT: 24 %L (ref 10–50)
POC MID %: 3.7 %M (ref 0–12)
RBC: 3.96 M/uL — AB (ref 4.04–5.48)
RDW, POC: 15.4 %
WBC: 7.9 10*3/uL (ref 4.6–10.2)

## 2017-06-07 LAB — POCT URINALYSIS DIP (MANUAL ENTRY)
GLUCOSE UA: NEGATIVE mg/dL
Leukocytes, UA: NEGATIVE
NITRITE UA: NEGATIVE
PH UA: 6 (ref 5.0–8.0)
RBC UA: NEGATIVE
Spec Grav, UA: 1.02 (ref 1.010–1.025)
UROBILINOGEN UA: 1 U/dL

## 2017-06-07 MED ORDER — OMEPRAZOLE 40 MG PO CPDR: 40.0000 mg | DELAYED_RELEASE_CAPSULE | Freq: Every day | ORAL | 0 refills | Status: DC

## 2017-06-07 MED ORDER — ASPIRIN EC 81 MG PO TBEC: 81.0000 mg | DELAYED_RELEASE_TABLET | Freq: Every day | ORAL | Status: DC
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
## 2017-06-07 NOTE — Patient Instructions (Addendum)
Restart omeprazole x 3 months - then we will decrease the dose. Eat 3 meals a day and drink at least 4 glasses of water a day. Stop your aspirin 325mg  and change to a coated aspirin 81mg  EC (ENTERIC COATED) daily.  State-County Special Assistance, In Home (Help to stay in your home). The State/County Special Assistance In-Home Program for Adults (SA/IH) provides a cash supplement to low-income individuals who are at risk of entering a residential facility. SA/IH provides additional support services and income to individuals who would prefer to live at home. Case managers at the [facility] of social services conduct comprehensive assessments to identify how certain factors would affect an individual's ability to live at home. The case managers work directly with the recipients, families and other caregivers to develop a care plan that enables the recipient to live at home.  contact your local Department of Social Services. [place]  [place] [facility] [phone] NA services 2-3x/wk to assist with bathing, personal care, linen change,, light meal preparation, trash removal. SN annual assessment and Quarterly Supervisory visits. Goal improve and maintain good skin integrity, increase safety with ADLs. Needs to be connected wiht other community services to assist with meeting needs as well.   IF you received an x-ray today, you will receive an invoice from [facility]. Please contact [facility] at [phone] with questions or concerns regarding your invoice.   IF you received labwork today, you will receive an invoice from [place]. Please contact [facility] at [phone] with questions or concerns regarding your invoice.   Our billing staff will not be able to assist you with questions regarding bills from these companies.  You will be contacted with the lab  results as soon as they are available. The fastest way to get your results is to activate your My Chart account. Instructions are located on the last page of this paperwork. If you have not heard from [place] regarding the results in 2 weeks, please contact this office.     Dehydration Dehydration is a condition in which there is not enough fluid or water in the body. This happens when you lose more fluids than you take in. Important organs, such as the kidneys, brain, and heart, cannot function without a proper amount of fluids. Any loss of fluids from the body can lead to dehydration. People age 20 or older have a higher risk of dehydration than younger adults because in older age, the body:  Is less able to conserve water.  Does not respond to temperature changes as well.  Does not get thirsty as easily or quickly.  Dehydration can range from mild to severe. This condition should be treated right away to prevent it from becoming severe. What are the causes? Dehydration may be caused by:  Vomiting.  Diarrhea.  Excessive sweating, such as from heat exposure or exercise.  Not drinking enough fluid, especially: ? When ill. ? While doing activity that requires a lot of energy.  Excessive urination.  Fever.  Infection.  Certain medicines, such as medicines that cause the body to lose excess fluid (diuretics).  Inability to access safe drinking water.  Poorly controlled blood sugars.  Reduced physical ability to get adequate water and food.  What increases the risk? This condition is more likely to develop in people who:  Have a long-term (chronic) illness, such as: ? An illness that may increase urination, such as diabetes. ? Kidney, heart, or  lung disease. ? Neurological or psychological disorders, such as dementia.  Are age 81 or older.  Are disabled.  Live in a place with high altitude.  What are the signs or symptoms? Symptoms of mild dehydration may  include:  Thirst.  Dry lips.  Slightly dry mouth.  Dry, warm skin.  Dizziness. Symptoms of moderate dehydration may include:  Very dry mouth.  Muscle cramps.  Dark urine. Urine may be the color of tea.  Decreased urine production.  Decreased tear production.  Heartbeat that is irregular or faster than normal (palpitations).  Headache.  Light-headedness, especially when you stand up from a sitting position.  Fainting (syncope). Symptoms of severe dehydration may include:  Changes in skin, such as: ? Cold and clammy skin. ? Blotchy (mottled) or pale skin. ? Skin that does not quickly return to normal after being lightly pinched and released (poor skin turgor).  Changes in body fluids, such as: ? Extreme thirst. ? No tear production. ? Inability to sweat when body temperature is high, such as in hot weather. ? Very little urine production.  Changes in vital signs, such as: ? Weak pulse. ? Pulse that is more than 100 beats a minute when sitting still. ? Rapid breathing. ? Low blood pressure.  Other changes, such as: ? Sunken eyes. ? Cold hands and feet. ? Confusion. ? Lack of energy (lethargy). ? Difficulty waking up from sleep. ? Short-term weight loss. ? Unconsciousness. How is this diagnosed? This condition is diagnosed based on your symptoms and a physical exam. Blood and urine tests may be done to help confirm the diagnosis. How is this treated? Treatment for this condition depends on the severity. Mild or moderate dehydration can often be treated at home. Treatment should be started right away. Do not wait until dehydration becomes severe. Severe dehydration is an emergency and it needs to be treated in a hospital. Treatment for mild dehydration may include:  Drinking more fluids.  Replacing salts and minerals in your blood (electrolytes). Treatment for moderate dehydration may include:  Drinking an oral rehydration solution (ORS). This is a drink  that helps you replace fluids and electrolytes (rehydrate). It is found at pharmacies and retail stores. Treatment for severe dehydration may include:  Receiving fluids through an IV tube.  Receiving an electrolyte solution through a tube passed through your nose and into your stomach (nasogastric tube or NG tube).  Correcting abnormalities in electrolytes.  Treating the underlying cause of dehydration. Follow these instructions at home:  If directed by your health care provider, drink an ORS: ? Make an ORS by following instructions on the package. ? Start by drinking small amounts, about  cup (120 mL) every 5-10 minutes. ? Slowly increase how much you drink until you have taken the amount recommended by your health care provider.  Drink enough clear fluid to keep your urine clear or pale yellow. If you were told to drink an ORS, finish the ORS first, then start slowly drinking other clear fluids. Drink fluids such as: ? Water. Do not drink only water. Doing that can lead to having too little salt (sodium) in the body (hyponatremia). ? Ice chips. ? Fruit juice that you have added water to (diluted fruit juice). ? Low-calorie sports drinks.  Avoid: ? Alcohol. ? Drinks that contain a lot of sugar. These include high-calorie sports drinks, fruit juice that is not diluted, and soda. ? Caffeine. ? Foods that are greasy or contain a lot of fat or sugar.  Take over-the-counter and prescription medicines only as told by your health care provider.  Do not take sodium tablets. This can lead to having too much sodium in the body (hypernatremia).  Eat foods that contain a healthy balance of electrolytes, such as bananas, oranges, potatoes, tomatoes, and spinach.  Keep all follow-up visits as told by your health care provider. This is important. Contact a health care provider if:  You have abdominal pain that: ? Gets worse. ? Stays in one area (localizes).  You have a rash.  You have  a stiff neck.  You are sleepier, more irritable, or more difficult to wake up than usual.  You feel weak, dizzy, or very thirsty. Get help right away if:  You have: ? Symptoms of severe dehydration. ? A fever. ? A severe headache. ? Vomiting or diarrhea that gets worse or does not go away. ? Diarrhea for more than 24 hours. ? Blood or green matter (bile) in your vomit. ? Blood in your stool. This may cause stool to look black and tarry. ? Trouble breathing.  You cannot drink fluids without vomiting.  Your symptoms get worse with treatment.  You have not urinated in 6-8 hours.  You have urinated only a small amount of very dark urine over 6-8 hours.  You faint.  Your heart rate while sitting still is over 100 beats a minute. This information is not intended to replace advice given to you by your health care provider. Make sure you discuss any questions you have with your health care provider. Document Released: 08/26/2003 Document Revised: 12/24/2015 Document Reviewed: 07/30/2015 Elsevier Interactive Patient Education  2018 [employer].   Rehydration Rehydration is the replacement of body fluids and salts and minerals (electrolytes) that are lost during dehydration. Dehydration is when there is not enough fluid or water in the body. This happens when you lose more fluids than you take in. People who are age 41 or older have a higher risk of dehydration than younger adults. Common causes of dehydration include:  Vomiting.  Diarrhea.  Excessive sweating, such as from heat exposure or exercise.  Taking medicines that cause the body to lose excess fluid (diuretics).  Impaired kidney function.  Not drinking enough fluid.  Certain illnesses or infections.  Certain poorly controlled long-term (chronic) illnesses, such as diabetes, heart disease, and kidney disease.  Symptoms of mild dehydration may include thirst, dry lips and mouth, dry skin, and dizziness. Symptoms of  severe dehydration may include increased heart rate, confusion, fainting, and not urinating. You can rehydrate by drinking certain fluids or getting fluids through an IV tube, as told by your health care provider. What are the risks? Generally, rehydration is safe. However, one problem that can happen is taking in too much fluid (overhydration). This is rare. If overhydration happens, it can cause an electrolyte imbalance, kidney failure, fluid in the lungs, or a decrease in salt (sodium) levels in the body. How to rehydrate Follow instructions from your health care provider for rehydration. The kind of fluid you should drink and the amount you should drink depend on your condition.  If directed by your health care provider, drink an oral rehydration solution (ORS). This is a drink designed to treat dehydration that is found in pharmacies and retail stores. ? Make an ORS by following instructions on the package. ? Start by drinking small amounts, about  cup (120 mL) every 5-10 minutes. ? Slowly increase how much you drink until you have taken the amount  recommended by your health care provider.  Drink enough clear fluids to keep your urine clear or pale yellow. If you were instructed to drink an ORS, finish the ORS first, then start slowly drinking other clear fluids. Drink fluids such as: ? Water. Do not drink only water. Doing that can lead to having too little sodium in your body (hyponatremia). ? Ice chips. ? Fruit juice that you have added water to (diluted juice). ? Low-calorie sports drinks.  If you are severely dehydrated, your health care provider may recommend that you receive fluids through an IV tube in the hospital.  Do not take sodium tablets. Doing that can lead to the condition of having too much sodium in your body (hypernatremia).  Eating while you rehydrate Follow instructions from your health care provider about what to eat while you rehydrate. Your health care provider  may recommend that you slowly begin eating regular foods in small amounts.  Eat foods that contain a healthy balance of electrolytes, such as bananas, oranges, potatoes, tomatoes, and spinach.  Avoid foods that are greasy or contain a lot of fat or sugar.  In some cases, you may get nutrition through a feeding tube that is passed through your nose and into your stomach (nasogastric tube, or NG tube). This may be done if you have uncontrolled vomiting or diarrhea. Beverages to avoid Certain beverages may make dehydration worse. While you rehydrate, avoid:  Alcohol.  Caffeine.  Drinks that contain a lot of sugar. These include: ? High-calorie sports drinks. ? Fruit juice that is not diluted. ? Soda.  Check nutrition labels to see how much sugar or caffeine a beverage contains. Signs of dehydration recovery You may be recovering from dehydration if:  You urinate more often than you did before you started rehydrating.  Your urine is clear or pale yellow.  Your energy level improves.  You vomit less frequently.  You have diarrhea less frequently.  Your appetite improves or returns to normal.  You feel less dizzy or less light-headed.  Your skin tone and color start to look more normal.  Contact a health care provider if:  You continue to have symptoms of mild dehydration, such as: ? Thirst. ? Dry lips. ? Slightly dry mouth. ? Dry, warm skin. ? Dizziness.  You continue to vomit or have diarrhea. Get help right away if:  You have symptoms of dehydration that get worse.  You feel: ? Confused. ? Weak. ? Like you are going to faint.  You have not urinated in 6-8 hours.  You have very dark urine.  You have trouble breathing.  Your heart rate while sitting still is over 100 beats a minute.  You cannot drink fluids without vomiting.  You have vomiting or diarrhea that: ? Gets worse. ? Does not go away.  You have a fever. This information is not intended to  replace advice given to you by your health care provider. Make sure you discuss any questions you have with your health care provider. Document Released: 08/28/2011 Document Revised: 12/24/2015 Document Reviewed: 07/30/2015 Elsevier Interactive Patient Education  [employer].

## 2017-06-08 LAB — COMPREHENSIVE METABOLIC PANEL
A/G RATIO: 1.6 (ref 1.2–2.2)
ALBUMIN: 4 g/dL (ref 3.2–4.6)
ALK PHOS: 54 IU/L (ref 39–117)
ALT: 11 IU/L (ref 0–32)
AST: 20 IU/L (ref 0–40)
BUN / CREAT RATIO: 14 (ref 12–28)
BUN: 18 mg/dL (ref 10–36)
Bilirubin Total: <0.2 mg/dL (ref 0.0–1.2)
CALCIUM: 9.5 mg/dL (ref 8.7–10.3)
CO2: 24 mmol/L (ref 20–29)
CREATININE: 1.26 mg/dL — AB (ref 0.57–1.00)
Chloride: 107 mmol/L — ABNORMAL HIGH (ref 96–106)
GFR calc Af Amer: 43 mL/min/{1.73_m2} — ABNORMAL LOW (ref >59)
GFR, EST NON AFRICAN AMERICAN: 37 mL/min/{1.73_m2} — AB (ref >59)
GLOBULIN, TOTAL: 2.5 g/dL (ref 1.5–4.5)
Glucose: 108 mg/dL — ABNORMAL HIGH (ref 65–99)
Potassium: 4.1 mmol/L (ref 3.5–5.2)
SODIUM: 147 mmol/L — AB (ref 134–144)
Total Protein: 6.5 g/dL (ref 6.0–8.5)

## 2017-06-08 LAB — URINE CULTURE

## 2017-06-11 ENCOUNTER — Encounter: Payer: Self-pay | Admitting: *Deleted

## 2017-06-20 ENCOUNTER — Telehealth: Payer: Self-pay

## 2017-06-20 NOTE — Telephone Encounter (Signed)
Copied from [place] [id number]. Topic: General - Other >> Jun 15, 2017 12:09 PM Ahmed Prima L wrote: [facility] said they received the referral for [facility] and they will be going out to see the pt on 1/2   >> Jun 20, 2017 12:17 PM Percell Belt A wrote: [facility] Breed from [facility] called and will be stating home health on the 06/23/2017

## 2017-06-20 NOTE — Telephone Encounter (Signed)
noted 

## 2017-06-25 ENCOUNTER — Telehealth: Payer: Self-pay | Admitting: Family Medicine

## 2017-06-25 NOTE — Telephone Encounter (Signed)
Copied from [place]. Topic: Quick Communication - See Telephone Encounter >> Jun 25, 2017  9:24 AM Burnis Medin, NT wrote: CRM for notification. See Telephone encounter for: Kylie Allen is calling for orders for pt to have a nursing home health aide for 1 week one and 2 week 2 and 1 week 5. Social worker order for 1 month 1 for community resources and home health 2 times a week for 7 weeks. She would like a call back.   06/25/17.

## 2017-06-27 NOTE — Telephone Encounter (Signed)
Great - pt's daughter would love this - she just wasn't sure how to go about it so glad SW will help.  FL2 was completed at time of visit. Yes, agree w/ HH orders. Thank you.

## 2017-06-27 NOTE — Telephone Encounter (Signed)
Spoke with Anderson Malta [facility] [phone]) Orders for RN visits 1x week x 1wk;  2x week x 2 wks; 1 x week x 5 wks [place] visits  2x week x 7 wks. Social Worker visit 1x month x 1 mo.  She states pt's dementia is progressing.  Currently lives in independent living.  Hoping SW can discuss with dtr to move to asst living or at least have [facility] in once per day to assist with bathing, ADL:s and administer meds correctly. Has observed pt not taking meds correctly.

## 2017-06-29 ENCOUNTER — Telehealth: Payer: Self-pay

## 2017-06-29 NOTE — Telephone Encounter (Signed)
Community resource referral received from Dr. Brigitte Pulse to assist patient with any caregiver support resources. Patient says her daughters are currently talking turns helping her and she is OK right now. I gave her my number in case her daughters had any questions. I also see [place] orders discussed in a previous note in the patient's chart so that need is being addressed with that as well.    Josepha Pigg, B.A.  Care Guide - [facility] [place]

## 2017-07-13 ENCOUNTER — Other Ambulatory Visit: Payer: Self-pay | Admitting: Family Medicine

## 2017-07-13 ENCOUNTER — Telehealth: Payer: Self-pay | Admitting: Family Medicine

## 2017-07-13 MED ORDER — RANITIDINE HCL 150 MG PO TABS: 150.0000 mg | ORAL_TABLET | Freq: Two times a day (BID) | ORAL | 5 refills | Status: DC | PRN

## 2017-07-13 MED ORDER — OMEPRAZOLE 40 MG PO CPDR: 40.0000 mg | DELAYED_RELEASE_CAPSULE | Freq: Every day | ORAL | 0 refills | Status: DC | PRN

## 2017-07-13 NOTE — Telephone Encounter (Signed)
Gabapentin refill.  LOV: 06/07/17. Last refilled on 02/01/17 with 180 caps  Ativan refill LOV: 06/07/17 Last refilled on 02/01/17 with 60 tabs

## 2017-07-13 NOTE — Telephone Encounter (Signed)
The paperwork is in Dr. Raul Del box

## 2017-07-13 NOTE — Telephone Encounter (Signed)
Pt daughter [name] garland dropped some ppw off from [facility] for Dr. Brigitte Pulse to sign and fax to [phone].Marland Kitchen Pt daughter wants a call back when paperwork is faxed at [phone].

## 2017-07-13 NOTE — Telephone Encounter (Signed)
Received notice from that she was noncompliant with her medication and did not have her multivitamin patient's home health - [facility] for her ranitidine at home.  She also had both pantoprazole and omeprazole on her med list.    Faxed note back to [facility] stating:   - okay to DC multivitamin if patient is unwilling to take.  If she will, leave on MAR as has poor p.o. intake at times.    - Ranitidine 150 p.o. twice daily sent to from the pharmacy.  -DC pantoprazole and change omeprazole to as needed.  Would like patient to use ranitidine for GERD rather than PPI due to stage III chronic disease.

## 2017-07-16 NOTE — Telephone Encounter (Signed)
pls advise

## 2017-07-18 ENCOUNTER — Encounter: Payer: Self-pay | Admitting: Family Medicine

## 2017-09-06 ENCOUNTER — Ambulatory Visit: Payer: Medicare Other | Admitting: Family Medicine

## 2017-09-10 ENCOUNTER — Other Ambulatory Visit: Payer: Self-pay | Admitting: Family Medicine

## 2017-09-10 NOTE — Telephone Encounter (Unsigned)
Copied from [place] [phone]. Topic: Quick Communication - Rx Refill/Question >> Sep 10, 2017  1:22 PM Neva Seat wrote: LORazepam Francee Gentile) 0.5 MG tablet  [facility], [place] - [place], [place] - [address] [place] [address] Phone: [phone] Fax: [phone]

## 2017-09-11 ENCOUNTER — Other Ambulatory Visit: Payer: Self-pay | Admitting: Family Medicine

## 2017-09-11 NOTE — Telephone Encounter (Signed)
Is not due for refill.  Sent in 3 mo refill of lorazepam to [facility] on 1/30 so it is to early for refill - cannot refill until 4/30 at the absolute earliest. However, the [place] controlled substance reporting system shows that pt did not even initially fill the 1/30 rx until 2/26 and hasn't received either of the 2 refills avail on it. Did this refill request originate from the pharm or from the pt?   If it was the pharm, please call pharm to see what happened to the refills on the 1/30 rx. If it was the pt, tell her to call the pharmacy to let them know she needs her rx on file refilled.

## 2017-09-11 NOTE — Telephone Encounter (Signed)
Ativan refill Last OV: 01/22/17 Last Refill:07/18/17 #60 tabs 2 RF Pharmacy:[facility] [address] PCP: Dr Brigitte Pulse

## 2017-09-11 NOTE — Telephone Encounter (Signed)
Please advise Dr. Brigitte Pulse Dgaddy, CMA

## 2017-09-12 ENCOUNTER — Telehealth: Payer: Self-pay | Admitting: Family Medicine

## 2017-09-12 NOTE — Telephone Encounter (Signed)
Pt is requesting refill of Prilosec.    LOV 06/07/17  Dr. Brigitte Pulse  Note per Dr. Brigitte Pulse:   AFTER 3 months - DO NOT REFILL THE OMEPRAZOLE due to CKDIII - OK to DECREASE DOSE to 20mg  and then refill.

## 2017-09-12 NOTE — Telephone Encounter (Signed)
Called pt to try and reschedule her missed appt from 09/06/17. She was a little confused and told me I should call her daughter. I called her daughter ([place] -good) and had to leave a message. I asked if she thought that if she needed to come in for an appt. to give [place] a call.   If daughter or pt calls in, please schedule her with Dr. Brigitte Pulse at their convenience.   Thanks!

## 2017-10-01 ENCOUNTER — Ambulatory Visit: Payer: Medicare Other | Admitting: Family Medicine

## 2017-10-13 ENCOUNTER — Other Ambulatory Visit: Payer: Self-pay | Admitting: Family Medicine

## 2017-10-15 NOTE — Telephone Encounter (Signed)
Gabapentin 100 mg and Ativan 0.5 mg refill requests  LOV 06/07/17 with Dr. Brigitte Pulse  Last refill for Ativan:  07/18/17  #60  [facility] - [place], [place] - [address]

## 2017-10-18 NOTE — Telephone Encounter (Signed)
Will address at pt's appointment on Friday 5/3.

## 2017-10-19 ENCOUNTER — Ambulatory Visit: Payer: Medicare Other | Admitting: Family Medicine

## 2017-10-20 ENCOUNTER — Ambulatory Visit: Payer: Medicaid Other | Admitting: Family Medicine

## 2017-10-27 ENCOUNTER — Ambulatory Visit: Payer: Medicaid Other | Admitting: Family Medicine

## 2017-11-15 ENCOUNTER — Ambulatory Visit: Payer: Medicaid Other | Admitting: Family Medicine

## 2017-11-19 ENCOUNTER — Ambulatory Visit: Payer: Medicaid Other | Admitting: Family Medicine

## 2017-11-26 ENCOUNTER — Telehealth: Payer: Self-pay | Admitting: Family Medicine

## 2017-11-26 NOTE — Telephone Encounter (Signed)
Copied from [place] [phone]. Topic: Appointment Scheduling - Scheduling Inquiry for Clinic >> Nov 26, 2017  7:42 PM Cecelia Byars, [place] wrote: Reason for CRM: Patients daughter called and would like to know if the patient can be seen by Dr Brigitte Pulse before her appointment on 01/31/18

## 2017-11-26 NOTE — Telephone Encounter (Signed)
Copied from [place] [phone]. Topic: General - Other >> Nov 26, 2017  7:38 PM Cecelia Byars, NT wrote: Reason for CRM: Patients daughter called and would like to know if she can have a prescription or a small  amount of the ativan filled in until her appointment until 01/31/18 ,

## 2017-12-03 MED ORDER — LORAZEPAM 0.5 MG PO TABS: ORAL_TABLET | ORAL | 1 refills | Status: DC

## 2017-12-03 NOTE — Telephone Encounter (Signed)
Please let daughter know it was sent to [facility].  Hope she is doing well.

## 2017-12-03 NOTE — Addendum Note (Signed)
Addended by: Shawnee Knapp on: 12/03/2017 12:41 AM   Modules accepted: Orders

## 2018-01-27 ENCOUNTER — Emergency Department (HOSPITAL_COMMUNITY): Payer: Medicare Other

## 2018-01-27 ENCOUNTER — Inpatient Hospital Stay (HOSPITAL_COMMUNITY)
Admission: EM | Admit: 2018-01-27 | Discharge: 2018-01-31 | DRG: 064 | Disposition: A | Discharge status: Hospice | Payer: Medicare Other | Attending: Internal Medicine | Admitting: Internal Medicine

## 2018-01-27 ENCOUNTER — Encounter (HOSPITAL_COMMUNITY): Payer: Self-pay | Admitting: Radiology

## 2018-01-27 DIAGNOSIS — S21201A Unspecified open wound of right back wall of thorax without penetration into thoracic cavity, initial encounter: Secondary | ICD-10-CM | POA: Diagnosis present

## 2018-01-27 DIAGNOSIS — Z515 Encounter for palliative care: Secondary | ICD-10-CM | POA: Diagnosis not present

## 2018-01-27 DIAGNOSIS — Z79899 Other long term (current) drug therapy: Secondary | ICD-10-CM

## 2018-01-27 DIAGNOSIS — Z6820 Body mass index (BMI) 20.0-20.9, adult: Secondary | ICD-10-CM | POA: Diagnosis not present

## 2018-01-27 DIAGNOSIS — I361 Nonrheumatic tricuspid (valve) insufficiency: Secondary | ICD-10-CM | POA: Diagnosis not present

## 2018-01-27 DIAGNOSIS — R29708 NIHSS score 8: Secondary | ICD-10-CM | POA: Diagnosis present

## 2018-01-27 DIAGNOSIS — R4701 Aphasia: Secondary | ICD-10-CM | POA: Diagnosis present

## 2018-01-27 DIAGNOSIS — I639 Cerebral infarction, unspecified: Secondary | ICD-10-CM | POA: Diagnosis present

## 2018-01-27 DIAGNOSIS — I10 Essential (primary) hypertension: Secondary | ICD-10-CM | POA: Diagnosis not present

## 2018-01-27 DIAGNOSIS — E876 Hypokalemia: Secondary | ICD-10-CM | POA: Diagnosis present

## 2018-01-27 DIAGNOSIS — R1310 Dysphagia, unspecified: Secondary | ICD-10-CM | POA: Diagnosis present

## 2018-01-27 DIAGNOSIS — N189 Chronic kidney disease, unspecified: Secondary | ICD-10-CM | POA: Diagnosis present

## 2018-01-27 DIAGNOSIS — F0390 Unspecified dementia without behavioral disturbance: Secondary | ICD-10-CM | POA: Diagnosis present

## 2018-01-27 DIAGNOSIS — L89899 Pressure ulcer of other site, unspecified stage: Secondary | ICD-10-CM | POA: Diagnosis present

## 2018-01-27 DIAGNOSIS — K219 Gastro-esophageal reflux disease without esophagitis: Secondary | ICD-10-CM | POA: Diagnosis present

## 2018-01-27 DIAGNOSIS — F419 Anxiety disorder, unspecified: Secondary | ICD-10-CM | POA: Diagnosis present

## 2018-01-27 DIAGNOSIS — I129 Hypertensive chronic kidney disease with stage 1 through stage 4 chronic kidney disease, or unspecified chronic kidney disease: Secondary | ICD-10-CM | POA: Diagnosis present

## 2018-01-27 DIAGNOSIS — I6340 Cerebral infarction due to embolism of unspecified cerebral artery: Secondary | ICD-10-CM | POA: Diagnosis not present

## 2018-01-27 DIAGNOSIS — L89112 Pressure ulcer of right upper back, stage 2: Secondary | ICD-10-CM

## 2018-01-27 DIAGNOSIS — Z87891 Personal history of nicotine dependence: Secondary | ICD-10-CM | POA: Diagnosis not present

## 2018-01-27 DIAGNOSIS — Z853 Personal history of malignant neoplasm of breast: Secondary | ICD-10-CM

## 2018-01-27 DIAGNOSIS — E43 Unspecified severe protein-calorie malnutrition: Secondary | ICD-10-CM | POA: Diagnosis present

## 2018-01-27 DIAGNOSIS — R627 Adult failure to thrive: Secondary | ICD-10-CM | POA: Diagnosis present

## 2018-01-27 DIAGNOSIS — E785 Hyperlipidemia, unspecified: Secondary | ICD-10-CM | POA: Diagnosis present

## 2018-01-27 DIAGNOSIS — B0229 Other postherpetic nervous system involvement: Secondary | ICD-10-CM | POA: Diagnosis present

## 2018-01-27 DIAGNOSIS — Z66 Do not resuscitate: Secondary | ICD-10-CM | POA: Diagnosis present

## 2018-01-27 DIAGNOSIS — I63412 Cerebral infarction due to embolism of left middle cerebral artery: Secondary | ICD-10-CM | POA: Diagnosis not present

## 2018-01-27 DIAGNOSIS — R414 Neurologic neglect syndrome: Secondary | ICD-10-CM | POA: Diagnosis present

## 2018-01-27 DIAGNOSIS — I700 Atherosclerosis of aorta: Secondary | ICD-10-CM | POA: Diagnosis present

## 2018-01-27 DIAGNOSIS — Z9011 Acquired absence of right breast and nipple: Secondary | ICD-10-CM

## 2018-01-27 DIAGNOSIS — R29810 Facial weakness: Secondary | ICD-10-CM | POA: Diagnosis present

## 2018-01-27 DIAGNOSIS — T17908A Unspecified foreign body in respiratory tract, part unspecified causing other injury, initial encounter: Secondary | ICD-10-CM

## 2018-01-27 LAB — COMPREHENSIVE METABOLIC PANEL
ALK PHOS: 46 U/L (ref 38–126)
ALT: 8 U/L (ref 0–44)
ANION GAP: 18 — AB (ref 5–15)
AST: 18 U/L (ref 15–41)
Albumin: 2.8 g/dL — ABNORMAL LOW (ref 3.5–5.0)
BUN: 12 mg/dL (ref 8–23)
CO2: 23 mmol/L (ref 22–32)
Calcium: 9 mg/dL (ref 8.9–10.3)
Chloride: 102 mmol/L (ref 98–111)
Creatinine, Ser: 0.97 mg/dL (ref 0.44–1.00)
GFR, EST AFRICAN AMERICAN: 57 mL/min — AB (ref >60)
GFR, EST NON AFRICAN AMERICAN: 50 mL/min — AB (ref >60)
Glucose, Bld: 107 mg/dL — ABNORMAL HIGH (ref 70–99)
Potassium: 2.9 mmol/L — ABNORMAL LOW (ref 3.5–5.1)
Sodium: 143 mmol/L (ref 135–145)
Total Bilirubin: 1.1 mg/dL (ref 0.3–1.2)
Total Protein: 6.1 g/dL — ABNORMAL LOW (ref 6.5–8.1)

## 2018-01-27 LAB — CBC WITH DIFFERENTIAL/PLATELET
Abs Immature Granulocytes: 0.1 10*3/uL (ref 0.0–0.1)
BASOS ABS: 0 10*3/uL (ref 0.0–0.1)
BASOS PCT: 0 %
EOS PCT: 0 %
Eosinophils Absolute: 0 10*3/uL (ref 0.0–0.7)
HCT: 38.1 % (ref 36.0–46.0)
Hemoglobin: 12 g/dL (ref 12.0–15.0)
Immature Granulocytes: 1 %
Lymphocytes Relative: 17 %
Lymphs Abs: 1.6 10*3/uL (ref 0.7–4.0)
MCH: 31.2 pg (ref 26.0–34.0)
MCHC: 31.5 g/dL (ref 30.0–36.0)
MCV: 99 fL (ref 78.0–100.0)
MONO ABS: 0.9 10*3/uL (ref 0.1–1.0)
MONOS PCT: 9 %
Neutro Abs: 6.8 10*3/uL (ref 1.7–7.7)
Neutrophils Relative %: 73 %
PLATELETS: 283 10*3/uL (ref 150–400)
RBC: 3.85 MIL/uL — ABNORMAL LOW (ref 3.87–5.11)
RDW: 12.5 % (ref 11.5–15.5)
WBC: 9.3 10*3/uL (ref 4.0–10.5)

## 2018-01-27 LAB — I-STAT CHEM 8, ED
BUN: 17 mg/dL (ref 8–23)
CHLORIDE: 102 mmol/L (ref 98–111)
Calcium, Ion: 1.04 mmol/L — ABNORMAL LOW (ref 1.15–1.40)
Creatinine, Ser: 0.8 mg/dL (ref 0.44–1.00)
Glucose, Bld: 113 mg/dL — ABNORMAL HIGH (ref 70–99)
HEMATOCRIT: 36 % (ref 36.0–46.0)
Hemoglobin: 12.2 g/dL (ref 12.0–15.0)
POTASSIUM: 4.8 mmol/L (ref 3.5–5.1)
SODIUM: 140 mmol/L (ref 135–145)
TCO2: 28 mmol/L (ref 22–32)

## 2018-01-27 LAB — I-STAT TROPONIN, ED: TROPONIN I, POC: 0.02 ng/mL (ref 0.00–0.08)

## 2018-01-27 LAB — ETHANOL

## 2018-01-27 MED ORDER — ASPIRIN 325 MG PO TABS: 325.0000 mg | ORAL_TABLET | Freq: Every day | ORAL | Status: DC

## 2018-01-27 MED ORDER — ACETAMINOPHEN 325 MG PO TABS: 650.0000 mg | ORAL_TABLET | ORAL | Status: DC | PRN

## 2018-01-27 MED ORDER — ATORVASTATIN CALCIUM 20 MG PO TABS: 40.0000 mg | ORAL_TABLET | Freq: Every day | ORAL | Status: DC

## 2018-01-27 MED ORDER — ACETAMINOPHEN 160 MG/5ML PO SOLN: 650.0000 mg | ORAL | Status: DC | PRN

## 2018-01-27 MED ORDER — STROKE: EARLY STAGES OF RECOVERY BOOK
Freq: Once | Status: AC
  Administered 2018-01-27: 22:00:00
  Filled 2018-01-27: qty 1

## 2018-01-27 MED ORDER — ENOXAPARIN SODIUM 40 MG/0.4ML ~~LOC~~ SOLN
40.0000 mg | SUBCUTANEOUS | Status: DC
  Administered 2018-01-27 – 2018-01-28 (×2): 40 mg via SUBCUTANEOUS
  Filled 2018-01-27 (×2): qty 0.4

## 2018-01-27 MED ORDER — POTASSIUM CHLORIDE CRYS ER 20 MEQ PO TBCR
40.0000 meq | EXTENDED_RELEASE_TABLET | Freq: Once | ORAL | Status: DC
  Filled 2018-01-27 (×2): qty 2

## 2018-01-27 MED ORDER — POTASSIUM CHLORIDE 10 MEQ/100ML IV SOLN
10.0000 meq | INTRAVENOUS | Status: DC
  Filled 2018-01-27 (×4): qty 100

## 2018-01-27 MED ORDER — ASPIRIN 300 MG RE SUPP
300.0000 mg | Freq: Every day | RECTAL | Status: DC
  Administered 2018-01-27 – 2018-01-29 (×3): 300 mg via RECTAL
  Filled 2018-01-27 (×3): qty 1

## 2018-01-27 MED ORDER — ACETAMINOPHEN 650 MG RE SUPP
650.0000 mg | RECTAL | Status: DC | PRN
  Administered 2018-01-28: 650 mg via RECTAL
  Filled 2018-01-27: qty 1

## 2018-01-27 MED ORDER — SODIUM CHLORIDE 0.9 % IV SOLN
INTRAVENOUS | Status: AC
  Administered 2018-01-27: 1000 mL via INTRAVENOUS

## 2018-01-27 MED ORDER — IOPAMIDOL (ISOVUE-370) INJECTION 76%
INTRAVENOUS | Status: AC
  Administered 2018-01-27: 50 mL
  Filled 2018-01-27: qty 50

## 2018-01-27 NOTE — ED Notes (Signed)
Per RN blood clotted and need to recollect cbc and cmp.

## 2018-01-27 NOTE — ED Triage Notes (Signed)
Pt arrives presenting with R sided facial droop and aphasia, LSW 1000. Dementia at baseline, Hx HTN and arythmia [facility] vitals 98 HR with sinus arythmia, 180/80 CBG 117 Pt has purlent stage 3 pressure ulcer under right arm

## 2018-01-27 NOTE — ED Provider Notes (Signed)
[facility] EMERGENCY DEPARTMENT Provider Note   CSN: [id number] Arrival date & time: 01/27/18  1541     History   Chief Complaint Chief Complaint  Patient presents with  . Stroke Symptoms    HPI Kylie Allen is a 84 y.o. female hx of CKD, hypertension, hyperlipidemia, here presenting with trouble speaking, right facial droop.  She has dementia at baseline but is ambulatory and lives at home.  Last normal was around 10 AM this morning.  Family was with her and noticed that she has trouble speaking and obvious right facial droop.  EMS was called and by the time they got there, patient was outside the TPA window so no code stroke was activated by EMS. She has known R axilla decub ulcer   The history is provided by the patient and the EMS personnel.    Past Medical History:  Diagnosis Date  . Bradycardia   . Breast cancer (HCC)    hx of  . CKD (chronic kidney disease)   . Depression with anxiety   . Diverticulosis    hx of  . Fatigue   . GERD (gastroesophageal reflux disease)   . H/O: hysterectomy   . History of echocardiogram    Echo (8/15): EF 55-60%, normal wall motion, grade 1 diastolic dysfunction, mild AI, moderate TR, PASP 54 mm Hg  . Hyperlipidemia   . Hypertension     Patient Active Problem List   Diagnosis Date Noted  . Anxiety state 01/22/2017  . PVC's (premature ventricular contractions) 12/30/2015  . SVT (supraventricular tachycardia) ([place]) 12/30/2015  . Medication noncompliance due to cognitive impairment 11/16/2015  . Dementia 08/26/2014  . Bradycardia 04/19/2011  . Hyperlipidemia 05/10/2009  . DEPRESSION 05/10/2009  . MIGRAINE HEADACHE 05/10/2009  . Essential hypertension 05/10/2009  . GERD 05/10/2009  . Chronic kidney disease 05/10/2009  . BREAST CANCER, HX OF 05/10/2009  . SYNCOPE, HX OF 05/10/2009  . DIVERTICULITIS, HX OF 05/10/2009  . Personal history of urinary disorder 05/10/2009    Past Surgical History:  Procedure  Laterality Date  . MASTECTOMY Right      OB History   None      Home Medications    Prior to Admission medications   Medication Sig Start Date End Date Taking? Authorizing Provider  amLODipine (NORVASC) 5 MG tablet Take 1 tablet (5 mg total) by mouth daily. 01/22/17   Shawnee Knapp, MD  aspirin EC 81 MG tablet Take 1 tablet (81 mg total) by mouth daily. 06/07/17   Shawnee Knapp, MD  gabapentin (NEURONTIN) 100 MG capsule TAKE 1 CAPSULE BY MOUTH 3 TIMES DAILY AS NEEDED FOR post SHINGLES PAIN AND 3 CAPSULES EVERY EVENING 07/18/17   Shawnee Knapp, MD  LORazepam (ATIVAN) 0.5 MG tablet TAKE 1/2 TO 1 TABLET BY MOUTH 2 TIMES DAILY AS NEEDED FOR ANXIETY 12/03/17   Shawnee Knapp, MD  omeprazole (PRILOSEC) 20 MG capsule Take 1 capsule (20 mg total) by mouth daily. 09/15/17   Shawnee Knapp, MD  omeprazole (PRILOSEC) 40 MG capsule Take 1 capsule (40 mg total) by mouth daily as needed (heartburn). 07/13/17   Shawnee Knapp, MD  ranitidine (ZANTAC) 150 MG tablet Take 1 tablet (150 mg total) by mouth 2 (two) times daily as needed for heartburn. 07/13/17   Shawnee Knapp, MD    Family History Family History  Problem Relation Age of Onset  . Coronary artery disease Mother        family  hx of  . Hypertension Mother        hx of  . Stroke Sister   . Heart attack Neg Hx     Social History Social History   Tobacco Use  . Smoking status: Former Research scientist (life sciences)  . Smokeless tobacco: Never Used  Substance Use Topics  . Alcohol use: No    Alcohol/week: 0.0 standard drinks  . Drug use: Not on file     Allergies   Patient has no known allergies.   Review of Systems Review of Systems  Skin: Positive for wound.  Neurological: Positive for speech difficulty and weakness.     Physical Exam Updated Vital Signs BP (!) 190/90 (BP Location: Left Arm)   Pulse (!) 105   Temp 98.7 F (37.1 C) (Oral)   Resp (!) 27   SpO2 98%   Physical Exam  Constitutional:  Chronically ill, asphasic   HENT:  Head: Normocephalic.    Obvious R facial droop   Eyes: Pupils are equal, round, and reactive to light. EOM are normal.  Neck: Normal range of motion. Neck supple.  Cardiovascular: Normal rate, regular rhythm and normal heart sounds.  Pulmonary/Chest: Effort normal and breath sounds normal. No stridor. No respiratory distress.  Abdominal: Soft. Bowel sounds are normal.  Neurological: She is alert.  Obvious R facial droop. + aphasia. Strength 5/5 bilateral arms and legs   Skin:  Stage 2 R axilla decub ulcer, foul smelling but no obvious cellulitis or abscess   Psychiatric:  Unable   Nursing note and vitals reviewed.    ED Treatments / Results  Labs (all labs ordered are listed, but only abnormal results are displayed) Labs Reviewed  ETHANOL  PROTIME-INR  APTT  CBC  DIFFERENTIAL  COMPREHENSIVE METABOLIC PANEL  RAPID URINE DRUG SCREEN, HOSP PERFORMED  URINALYSIS, ROUTINE W REFLEX MICROSCOPIC  I-STAT CHEM 8, ED  I-STAT TROPONIN, ED    EKG None  Radiology No results found.  Procedures Procedures (including critical care time)  Medications Ordered in ED Medications - No data to display   Initial Impression / Assessment and Plan / ED Course  I have reviewed the triage vital signs and the nursing notes.  Pertinent labs & imaging results that were available during my care of the patient were reviewed by me and considered in my medical decision making (see chart for details).    Kylie Allen is a 84 y.o. female here with R facial droop, slurred speech. Patient outside of TPA window. However, patient does have slurred speech, R facial droop and within 24 hr window for intervention. I discussed case with Dr. Leonel Ramsay from neurology on arrival. Since patient is DNR and has bad dementia at baseline, he wants to hold off on code stroke LVO, just get CTA head/neck, CT perfusion. He will see patient.    7:03 PM CTA showed L frontal stroke. Neuro recommend inpatient stroke workup. CBC clotted,  will redraw. CXR showed no obvious pneumonia. Hospitalist to admit for stroke workup, R axilla decub ulcer   Final Clinical Impressions(s) / ED Diagnoses   Final diagnoses:  None    ED Discharge Orders    None       Drenda Freeze, MD 01/27/18 1904

## 2018-01-27 NOTE — H&P (Signed)
History and Physical   Kylie Allen ZES:[id number] DOB: February 05, 1924 DOA: 01/27/2018  PCP: Shawnee Knapp, MD  Chief Complaint: cannot talk  HPI: this a 84 year old woman with medical problems including recurrent history of right-sided breast cancer treated with mastectomy and radiation, advanced dementia with requirement of ADL assistance and at baseline recognizes daughters - spends over 90% of time in bed, anxiety, postherpetic neuralgia, and hypertension who presents with difficulty speaking. History is obtained from the patient's daughters who are present with her at the bedside include these include Drue Dun and Lynnell Grain.  They report that the patient moved in with [name] about 4 months ago, currently lives with [name]'s family. They previously had home care nurse visiting to help take care of the patient with regard to ADLs, but the patient dismissed them/refused. 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 Family reports she's had progressive weight loss within the past 2-3 months, previously weighing 124 pounds now down to 93 pounds as measured on the Friday before admission.  Per the patient family report, the patient was normal at 11 AM, she was speaking at that time. They attempted to bring her lunch at about 1 PM on the day of admission, found her to have right-sided facial droop as well as inability to talk. Attempts to get her to eat were unsuccessful, EMS called resulting in transport to the [facility].  ED Course: vital signs reviewed remarkable for tachycardia to 100-110, respiratory rate 29, systolic blood pressure ranging from 180-190 mmHg.  Diagnostics obtained include CT angiogram of the head and neck which revealed acute/subacute left frontal lobe cortical infarct without hemorrhage, some focal vessel irregularity with normal left cervical internal carotid artery raising suspicion for fibromuscular dysplasia, and right upper lobe  lung nodule 4 mm. Chest x-ray revealed no acute abnormality.  I stat labs remarkable for hemoglobin of 12.2, creatinine 0.8, normal sodium and potassium.  Neurology team consulted recommended further diagnostics including MRI brain, transthoracic echocardiogram, by mouth aspirin and statin after passing swallow eval, as well as DVT prophylaxis, PT OT [facility]  Hospital medicine consulted for further management.  Review of Systems: A complete ROS was not able to be obtained due to the patient's underlying aphasia  Past Medical History:  Diagnosis Date  . Bradycardia   . Breast cancer (HCC)    hx of  . CKD (chronic kidney disease)   . Depression with anxiety   . Diverticulosis    hx of  . Fatigue   . GERD (gastroesophageal reflux disease)   . H/O: hysterectomy   . History of echocardiogram    Echo (8/15): EF 55-60%, normal wall motion, grade 1 diastolic dysfunction, mild AI, moderate TR, PASP 54 mm Hg  . Hyperlipidemia   . Hypertension    Social History   Socioeconomic History  . Marital status: Widowed    Spouse name: Not on file  . Number of children: Not on file  . Years of education: Not on file  . Highest education level: Not on file  Occupational History  . Not on file  Social Needs  . Financial resource strain: Not on file  . Food insecurity:    Worry: Not on file    Inability: Not on file  . Transportation needs:    Medical: Not on file    Non-medical: Not on file  Tobacco Use  . Smoking status: Former Research scientist (life sciences)  . Smokeless tobacco: Never Used  Substance and Sexual Activity  .  Alcohol use: No    Alcohol/week: 0.0 standard drinks  . Drug use: Not on file  . Sexual activity: Not on file  Lifestyle  . Physical activity:    Days per week: Not on file    Minutes per session: Not on file  . Stress: Not on file  Relationships  . Social connections:    Talks on phone: Not on file    Gets together: Not on file    Attends religious service: Not on file    Active member  of club or organization: Not on file    Attends meetings of clubs or organizations: Not on file    Relationship status: Not on file  . Intimate partner violence:    Fear of current or ex partner: Not on file    Emotionally abused: Not on file    Physically abused: Not on file    Forced sexual activity: Not on file  Other Topics Concern  . Not on file  Social History Narrative  . Not on file   Family History  Problem Relation Age of Onset  . Coronary artery disease Mother        family hx of  . Hypertension Mother        hx of  . Stroke Sister   . Heart attack Neg Hx    Physical Exam: Vitals:   01/27/18 1546 01/27/18 1554 01/27/18 1600 01/27/18 1615  BP:  (!) 190/90 (!) 170/114 (!) 187/88  Pulse:  (!) 105 96 (!) 51  Resp:  (!) 27 (!) 24 (!) 24  Temp: 98.7 F (37.1 C) 98.7 F (37.1 C)    TempSrc: Oral Oral    SpO2:  98% 96% 98%   General: Appears calm and comfortable, elderly, frail and cachetic black woman ENT: Dry mucus membranes, remnant white food present Cardiovascular: RRR. No M/R/G. No LE edema.  Respiratory/ Chest: CTA bilaterally anteriorly. No wheezes or crackles. Normal respiratory effort.  On room air.  Right mastectomy scar present. Abdomen: Soft, mildly tender to palpation. Skin: Right axillary region with malodorous ulceration, unclear staging. Skin thinning. Dry skin, particularly b/l lower extremities. Musculoskeletal: Temporal muscle wasting present, globally reduced muscle mass Psychiatric: challenging to assess given aphasia Neurologic: Remarkable for right facial droop, and expressive aphasia - unable to speak at all.  Follows simple commands, squeezes hands b/l.  Pupils b/l round and reactive to light.  I have personally reviewed the following labs, culture data, and imaging studies.  Assessment/Plan:  #Left frontal cortical CVA, acute Course: presenting with right sided facial droop + expressive aphasia, deemed out of tPA window by neurology  team Plan: Will have swallowing status evaluated, along with PT/OT needs. Of note, the patient expressed to her daughters that she would not want surgically placed feeding tube for nutrition and hydration. Will continue diagnostic work up - MRI brain + TTE with telemetry monitoring to evaluate for underlying cause A1c + lipid profile for risk stratification Spoke with neurology consult service, agreed for permissive HTN at this time. NPO until swallowing status cleared. ASA 325 PO or per rectum now. Atorvastatin 40 mg once enteral route safe  #Other problems: -Dementia, severe: prior to CVA, sounds like FAST score of 6c at least, was refusing outside visiting nursing care; needs ADL assistance, spending > 90% of time in bed -Anxiety, hx of: has taken Ativan 0.5 mg in the past (family reports ~qod) -Post-herpetic neuralgia: has taken gabapentin in the past for hits -HTN: family reports she  has not taken her CCB (amlodipine) in weeks -Hypokalemia: K on admission of 2.9 - will replace via IV until PO established -Right axillary pressure induced ulcer / skin injury: no signs of systemic infection / inflammation - wound care team consult order placed for topical wound recommendations -Severe protein calorie malnutrition: muscle wasting present, low albumin (2.8) on admission, temporal wasting, >30# wt loss in past 2-3 months.  DVT prophylaxis: Subq Lovenox Code Status: DNR per 2 daughters report  Disposition Plan: to be determined Consults called: neurology consulted by emergency medicine team Admission status: admit to telemetry floor, hospital medicine   Cheri Rous, MD [facility] Page:[phone]  If 7PM-7AM, please contact night-coverage [email or web address] Password [email or web address]

## 2018-01-27 NOTE — Progress Notes (Signed)
Have tried repeatedly to contact patients RN.  No answer.  Need call from RN to get MRI scheduled to come down.

## 2018-01-27 NOTE — Consult Note (Addendum)
Neurology Consultation  Reason for Consult:  Referring Physician: Dr. Darl Householder  CC: Aphasia   History is obtained from: Patients daughter at bedside   HPI: Kylie Allen is a 84 y.o. female with a PMH of Breast CA, CKD,HTN, HLD, Depression and anxiety,  And advanced dementia chief complaint of aphasia. She resides with her daughter Kylie Allen for the last 6 months. Approximately 10 am this morning the daughter apparently noticed her mom with impaired speech. NIHSS 8. Patient does need assist with ADL's and apparently there is some caregiver role strain per patient's other daughter who presents at bedside. Patient is not a candidate for TPA as she is outside of window, and she is not a candidate for endovascular care.   ROS:  Unable to obtain due to altered speech- with aphasia    Past Medical History:  Diagnosis Date  . Bradycardia   . Breast cancer (HCC)    hx of  . CKD (chronic kidney disease)   . Depression with anxiety   . Diverticulosis    hx of  . Fatigue   . GERD (gastroesophageal reflux disease)   . H/O: hysterectomy   . History of echocardiogram    Echo (8/15): EF 55-60%, normal wall motion, grade 1 diastolic dysfunction, mild AI, moderate TR, PASP 54 mm Hg  . Hyperlipidemia   . Hypertension     Family History  Problem Relation Age of Onset  . Coronary artery disease Mother        family hx of  . Hypertension Mother        hx of  . Stroke Sister   . Heart attack Neg Hx     Social History:   reports that she has quit smoking. She has never used smokeless tobacco. She reports that she does not drink alcohol. Her drug history is not on file.  Medications  Current Facility-Administered Medications:  .  iopamidol (ISOVUE-370) 76 % injection, , , ,   Current Outpatient Medications:  .  amLODipine (NORVASC) 5 MG tablet, Take 1 tablet (5 mg total) by mouth daily., Disp: 30 tablet, Rfl: 5 .  aspirin EC 81 MG tablet, Take 1 tablet (81 mg total) by mouth daily., Disp: ,  Rfl:  .  gabapentin (NEURONTIN) 100 MG capsule, TAKE 1 CAPSULE BY MOUTH 3 TIMES DAILY AS NEEDED FOR post SHINGLES PAIN AND 3 CAPSULES EVERY EVENING, Disp: 180 capsule, Rfl: 2 .  LORazepam (ATIVAN) 0.5 MG tablet, TAKE 1/2 TO 1 TABLET BY MOUTH 2 TIMES DAILY AS NEEDED FOR ANXIETY, Disp: 60 tablet, Rfl: 1 .  omeprazole (PRILOSEC) 20 MG capsule, Take 1 capsule (20 mg total) by mouth daily., Disp: 90 capsule, Rfl: 0 .  omeprazole (PRILOSEC) 40 MG capsule, Take 1 capsule (40 mg total) by mouth daily as needed (heartburn)., Disp: 60 capsule, Rfl: 0 .  ranitidine (ZANTAC) 150 MG tablet, Take 1 tablet (150 mg total) by mouth 2 (two) times daily as needed for heartburn., Disp: 60 tablet, Rfl: 5   Exam: Current vital signs: BP (!) 187/88   Pulse (!) 51   Temp 98.7 F (37.1 C) (Oral)   Resp (!) 24   SpO2 98%  Vital signs in last 24 hours: Temp:  [98.7 F (37.1 C)] 98.7 F (37.1 C) (08/11 1554) Pulse Rate:  [51-105] 51 (08/11 1615) Resp:  [24-27] 24 (08/11 1615) BP: (170-190)/(88-114) 187/88 (08/11 1615) SpO2:  [96 %-98 %] 98 % (08/11 1615)  GENERAL: Awake, alert in NAD HEENT: - Normocephalic and  atraumatic, dry mm, no LN++, no Thyromegally LUNGS - Clear to auscultation bilaterally with no wheezes CV - S1S2 RRR, no m/r/g, equal pulses bilaterally. ABDOMEN - Soft, nontender, nondistended with normoactive BS Ext: warm, well perfused, intact peripheral pulses. Skin dry and flaky t/o   NEURO:  Mental Status: Alert to self with nod only, unable to answer further questions with aphasia  Language: speech is with expressive aphasia some mild component of receptive aphasia/ dysarthria. Patient follows simple commands, and needs to mimic others with prompting   Cranial Nerves: PERRL 3 mm/brisk.  EOMI, blink to threat and bilaterally- tracks, identifies bilaterally with 1 eye covered- no neglect noted  facial asymmetry-below nasal labial   facial sensation intact, hearing intact, tongue/uvula/soft palate  midline, normal sternocleidomastoid and trapezius muscle strength. No evidence of tongue atrophy or fibrillations  Motor: 5/5 BUE, 4/5 BLE  Generalized weakness no drift Tone: is normal and bulk is normal Sensation- Intact to light touch bilaterally Coordination: FTN intact bilaterally, no ataxia in BLE. Gait- deferred  NIHSS 8- Facial droop +2, Aphasia 2, LOC questions +2, Dysarthria +2   Labs I have reviewed labs in epic and the results pertinent to this consultation are: Largely unremarkable.   CBC    Component Value Date/Time   WBC 7.9 06/07/2017 1757   WBC 8.6 12/17/2013 1713   RBC 3.96 (A) 06/07/2017 1757   RBC 4.27 12/17/2013 1713   HGB 12.2 01/27/2018 1624   HGB 11.8 01/22/2017 1538   HCT 36.0 01/27/2018 1624   HCT 36.7 01/22/2017 1538   PLT 329 01/22/2017 1538   MCV 96.3 06/07/2017 1757   MCV 95 01/22/2017 1538   MCH 30.4 06/07/2017 1757   MCH 30.2 12/17/2013 1713   MCHC 31.5 (A) 06/07/2017 1757   MCHC 33.6 12/17/2013 1713   RDW 14.2 01/22/2017 1538   LYMPHSABS 1.2 01/22/2017 1538   MONOABS 0.9 08/29/2012 1745   EOSABS 0.0 01/22/2017 1538   BASOSABS 0.0 01/22/2017 1538    CMP     Component Value Date/Time   NA 140 01/27/2018 1624   NA 147 (H) 06/07/2017 1725   K 4.8 01/27/2018 1624   CL 102 01/27/2018 1624   CO2 24 06/07/2017 1725   GLUCOSE 113 (H) 01/27/2018 1624   BUN 17 01/27/2018 1624   BUN 18 06/07/2017 1725   CREATININE 0.80 01/27/2018 1624   CREATININE 1.16 (H) 11/10/2015 1457   CALCIUM 9.5 06/07/2017 1725   PROT 6.5 06/07/2017 1725   ALBUMIN 4.0 06/07/2017 1725   AST 20 06/07/2017 1725   ALT 11 06/07/2017 1725   ALKPHOS 54 06/07/2017 1725   BILITOT <0.2 06/07/2017 1725   GFRNONAA 37 (L) 06/07/2017 1725   GFRAA 43 (L) 06/07/2017 1725    Lipid Panel     Component Value Date/Time   CHOL 177 08/17/2012 1537   TRIG 85 08/17/2012 1537   HDL 72 08/17/2012 1537   CHOLHDL 2.5 08/17/2012 1537   VLDL 17 08/17/2012 1537   LDLCALC 88  08/17/2012 1537     Imaging  CT Brain -scan of the brain- pending  CTA pending    Assessment:  Kylie Allen is a 84 y.o. female with a PMH of DNR status Breast CA, CKD,HTN, HLD, Depression and anxiety, and advanced dementia chief complaint of aphasia. She resides with her daughter Kylie Allen for the last 6 months. Approximately 10 am this morning the daughter apparently noticed her mom with impaired speech, and brought her into the Emergency later on in  the day, She is not a candidate for TPA or intervention   Impression: High suspicion for L MCA stroke- awaiting imaging for confirmation of stroke and possible LVO.   Recommendations: Permissive hypertension MRI brain w/o contrast  2D Echo ASA 325 mg po daily- after passing swallow eval  Atorvastatin 40 mg po hs   PT/OT/ ST for swallow and linguistic studies  DVT prophylaxis   Kylie Rack, MD [facility] [phone]  If 7pm- 7am, please page neurology on call as listed in [place].

## 2018-01-27 NOTE — ED Notes (Signed)
Lab at bedside to redraw CBC and CMP.

## 2018-01-27 NOTE — Progress Notes (Signed)
Patient arrived around 2030 alert but mute with expensive and possible receptive aphasia right side weakness and facial droop K is low will continue to monitor.

## 2018-01-28 ENCOUNTER — Inpatient Hospital Stay (HOSPITAL_COMMUNITY): Payer: Medicare Other

## 2018-01-28 ENCOUNTER — Other Ambulatory Visit: Payer: Self-pay

## 2018-01-28 ENCOUNTER — Encounter (HOSPITAL_COMMUNITY): Payer: Self-pay | Admitting: General Practice

## 2018-01-28 DIAGNOSIS — I361 Nonrheumatic tricuspid (valve) insufficiency: Secondary | ICD-10-CM

## 2018-01-28 DIAGNOSIS — I10 Essential (primary) hypertension: Secondary | ICD-10-CM

## 2018-01-28 DIAGNOSIS — E876 Hypokalemia: Secondary | ICD-10-CM

## 2018-01-28 DIAGNOSIS — R627 Adult failure to thrive: Secondary | ICD-10-CM

## 2018-01-28 DIAGNOSIS — I63412 Cerebral infarction due to embolism of left middle cerebral artery: Secondary | ICD-10-CM

## 2018-01-28 LAB — LIPID PANEL
Cholesterol: 222 mg/dL — ABNORMAL HIGH (ref 0–200)
HDL: 44 mg/dL (ref >40)
LDL CALC: 159 mg/dL — AB (ref 0–99)
Total CHOL/HDL Ratio: 5 RATIO
Triglycerides: 94 mg/dL (ref <150)
VLDL: 19 mg/dL (ref 0–40)

## 2018-01-28 LAB — BASIC METABOLIC PANEL
Anion gap: 13 (ref 5–15)
BUN: 10 mg/dL (ref 8–23)
CALCIUM: 8.8 mg/dL — AB (ref 8.9–10.3)
CO2: 27 mmol/L (ref 22–32)
CREATININE: 0.98 mg/dL (ref 0.44–1.00)
Chloride: 103 mmol/L (ref 98–111)
GFR calc Af Amer: 57 mL/min — ABNORMAL LOW (ref >60)
GFR calc non Af Amer: 49 mL/min — ABNORMAL LOW (ref >60)
Glucose, Bld: 112 mg/dL — ABNORMAL HIGH (ref 70–99)
POTASSIUM: 2.8 mmol/L — AB (ref 3.5–5.1)
SODIUM: 143 mmol/L (ref 135–145)

## 2018-01-28 LAB — CBC
HCT: 34.3 % — ABNORMAL LOW (ref 36.0–46.0)
Hemoglobin: 11.2 g/dL — ABNORMAL LOW (ref 12.0–15.0)
MCH: 31.2 pg (ref 26.0–34.0)
MCHC: 32.7 g/dL (ref 30.0–36.0)
MCV: 95.5 fL (ref 78.0–100.0)
PLATELETS: 309 10*3/uL (ref 150–400)
RBC: 3.59 MIL/uL — ABNORMAL LOW (ref 3.87–5.11)
RDW: 12.5 % (ref 11.5–15.5)
WBC: 9 10*3/uL (ref 4.0–10.5)

## 2018-01-28 LAB — ECHOCARDIOGRAM COMPLETE
HEIGHTINCHES: 63 in
WEIGHTICAEL: 1869.5 [oz_av]

## 2018-01-28 LAB — GLUCOSE, CAPILLARY: GLUCOSE-CAPILLARY: 86 mg/dL (ref 70–99)

## 2018-01-28 MED ORDER — POTASSIUM CHLORIDE 20 MEQ/15ML (10%) PO SOLN
40.0000 meq | Freq: Once | ORAL | Status: DC
  Filled 2018-01-28: qty 30

## 2018-01-28 MED ORDER — HYDRALAZINE HCL 20 MG/ML IJ SOLN
10.0000 mg | Freq: Once | INTRAMUSCULAR | Status: AC
  Administered 2018-01-28: 10 mg via INTRAVENOUS
  Filled 2018-01-28: qty 1

## 2018-01-28 MED ORDER — METOPROLOL TARTRATE 5 MG/5ML IV SOLN
5.0000 mg | Freq: Once | INTRAVENOUS | Status: AC
  Administered 2018-01-28: 5 mg via INTRAVENOUS
  Filled 2018-01-28: qty 5

## 2018-01-28 MED ORDER — LORAZEPAM 2 MG/ML IJ SOLN
1.0000 mg | Freq: Once | INTRAMUSCULAR | Status: AC
  Administered 2018-01-28: 1 mg via INTRAVENOUS
  Filled 2018-01-28: qty 1

## 2018-01-28 MED ORDER — POTASSIUM CHLORIDE 10 MEQ/100ML IV SOLN
10.0000 meq | INTRAVENOUS | Status: AC
  Administered 2018-01-28 – 2018-01-29 (×6): 10 meq via INTRAVENOUS
  Filled 2018-01-28 (×6): qty 100

## 2018-01-28 MED ORDER — SODIUM CHLORIDE 0.9 % IV SOLN
INTRAVENOUS | Status: DC
  Administered 2018-01-29: 02:00:00 via INTRAVENOUS

## 2018-01-28 MED ORDER — LORAZEPAM 2 MG/ML IJ SOLN
1.0000 mg | Freq: Four times a day (QID) | INTRAMUSCULAR | Status: DC | PRN
  Administered 2018-01-28: 1 mg via INTRAVENOUS
  Filled 2018-01-28: qty 1

## 2018-01-28 MED ORDER — MAGNESIUM SULFATE 2 GM/50ML IV SOLN
2.0000 g | Freq: Once | INTRAVENOUS | Status: AC
Start: 2018-01-28 — End: 2018-01-28
  Administered 2018-01-28: 2 g via INTRAVENOUS
  Filled 2018-01-28: qty 50

## 2018-01-28 MED ORDER — ATORVASTATIN CALCIUM 40 MG PO TABS: 40.0000 mg | ORAL_TABLET | Freq: Every day | ORAL | Status: DC

## 2018-01-28 MED ORDER — HYDRALAZINE HCL 20 MG/ML IJ SOLN
5.0000 mg | Freq: Three times a day (TID) | INTRAMUSCULAR | Status: DC | PRN
Start: 2018-01-28 — End: 2018-01-29
  Administered 2018-01-29: 5 mg via INTRAVENOUS
  Filled 2018-01-28: qty 1

## 2018-01-28 NOTE — Progress Notes (Signed)
Patient has pulled 1 IV out her daughters have left they were overstimulating her and when I tried to give the IV Hydralazine she pulled the IV out. Daughter were witness to this and were ok with wrist restraints I haave notified attending awaiting response.

## 2018-01-28 NOTE — Progress Notes (Signed)
Patient is still fighting but her HR and BP are w/in specified limits will continue to monitor. We are attempting the 12 lead EKG now.

## 2018-01-28 NOTE — Evaluation (Signed)
Occupational Therapy Evaluation Patient Details Name: Kylie Allen MRN: [id number] DOB: September 26, 1923 Today's Date: 01/28/2018    History of Present Illness Kylie Allen is a 84 y.o. female with a PMH of Breast CA, CKD,HTN, HLD, Depression and anxiety, and advanced dementia;  chief complaint of aphasia. CT found Acute/subacute left frontal lobe cortical infarct without   Clinical Impression   Unsure of Pt's Prior levels - gleaned from chart review that she requires assistance with ADL. At this time, Pt has deficits in RUE which impact her ability to perform BUE tasks, making her more dependent in ADL. She is overall mod A for ADL at this time. Today she was able to perform SPT to [facility] with min A +2 and mod A +2. Pt following simple one steps commands with slight delay at approx 75% accuracy. OT will continue to follow acutely - and Pt will require SNF level care at discharge. Next session to focus on BUE task and establish HEP for RUE.     Follow Up Recommendations  SNF;Supervision/Assistance - 24 hour    Equipment Recommendations  Other (comment)(defer to next venue)    Recommendations for Other Services       Precautions / Restrictions Precautions Precautions: Fall Restrictions Weight Bearing Restrictions: No      Mobility Bed Mobility Overal bed mobility: Needs Assistance Bed Mobility: Supine to Sit;Sit to Supine     Supine to sit: Min assist;+2 for physical assistance;+2 for safety/equipment;HOB elevated(use of bed pad to bring hips EOB, assist to elevate trunk) Sit to supine: Min assist;+2 for physical assistance;+2 for safety/equipment;HOB elevated(assist for BLE back into bed, safety in lowering trunk)      Transfers Overall transfer level: Needs assistance Equipment used: 2 person hand held assist;1 person hand held assist Transfers: Sit to/from [facility] Sit to Stand: Min assist;+2 safety/equipment Stand pivot transfers: Min assist;+2  safety/equipment       General transfer comment: Initially Pt min A +2 for safety - multimodal cues used throughout the session. Pt able to perform transfer back to the bed from [facility] with min A +1; assist for boost and balance - left side of patient seemed more stable (right side has big bruise in arm pit)    Balance Overall balance assessment: Needs assistance Sitting-balance support: No upper extremity supported;Feet supported Sitting balance-Leahy Scale: Fair Sitting balance - Comments: EOB   Standing balance support: Single extremity supported Standing balance-Leahy Scale: Poor Standing balance comment: dependent on external support                           ADL either performed or assessed with clinical judgement   ADL Overall ADL's : Needs assistance/impaired Eating/Feeding: Moderate assistance;Bed level Eating/Feeding Details (indicate cue type and reason): unable to grasp spoon at this time and coordinate it to her mouth Grooming: Sitting;Wash/dry face;Set up;Bed level Grooming Details (indicate cue type and reason): will require more assist with more complex grooming tasks like oral care due to decreased coordination in RUE Upper Body Bathing: Moderate assistance   Lower Body Bathing: Maximal assistance   Upper Body Dressing : Moderate assistance   Lower Body Dressing: Maximal assistance   Toilet Transfer: Minimal assistance;+2 for safety/equipment;Stand-pivot;BSC Toilet Transfer Details (indicate cue type and reason): 1 person HHA, initially 2 people for safety Toileting- Clothing Manipulation and Hygiene: Min guard;Sit to/from stand;Sitting/lateral lean Toileting - Clothing Manipulation Details (indicate cue type and reason): Pt able to perform front and rear  peri care in sitting, and then in standing with warm wash cloth        General ADL Comments: RUE deficits impact independence in ADL/BUE tasks, Pt mostly following one step commands with slightly  increased time 75% of the time     Vision Baseline Vision/History: Wears glasses Additional Comments: Not assessed this evaluation due to impaired communication.      Perception     Praxis      Pertinent Vitals/Pain Pain Assessment: Faces Faces Pain Scale: Hurts a little bit Pain Location: (general discomfort) Pain Descriptors / Indicators: Discomfort Pain Intervention(s): Limited activity within patient's tolerance;Monitored during session     Hand Dominance Right   Extremity/Trunk Assessment Upper Extremity Assessment Upper Extremity Assessment: RUE deficits/detail;Generalized weakness RUE Deficits / Details: grasp grossly 3/5, drift present, increased time and effort for all movement RUE Sensation: WNL(Pt reports it is WFL (nods)) RUE Coordination: decreased fine motor;decreased gross motor(increased time and effort)   Lower Extremity Assessment Lower Extremity Assessment: Defer to PT evaluation   Cervical / Trunk Assessment Cervical / Trunk Assessment: Kyphotic   Communication Communication Communication: Expressive difficulties(unable to speak or write at this time)   Cognition Arousal/Alertness: Awake/alert Behavior During Therapy: WFL for tasks assessed/performed Overall Cognitive Status: Difficult to assess                                 General Comments: Pt able to follow one step commands 75% of the time, she got frustrated with her lack of ability to communicate through verbal or written communication. She was aware the she was drooling and took initiative to wipe her mouth herself.    General Comments  HR noted to be up at 128 - RN advised    Exercises     Shoulder Instructions      Home Living Family/patient expects to be discharged to:: Skilled nursing facility Living Arrangements: Children                               Additional Comments: Pt unable to communicate during session, and no family present, information  gleaned from chart review      Prior Functioning/Environment Level of Independence: Needs assistance  Gait / Transfers Assistance Needed: used a SPC? ADL's / Homemaking Assistance Needed: had an aide that assisted with ADL Communication / Swallowing Assistance Needed: ?? Comments: Pt unable to communicate this session        OT Problem List: Decreased strength;Decreased range of motion;Decreased activity tolerance;Impaired balance (sitting and/or standing);Decreased safety awareness;Impaired UE functional use      OT Treatment/Interventions: Self-care/ADL training;DME and/or AE instruction;Therapeutic activities;Patient/family education;Balance training;Therapeutic exercise    OT Goals(Current goals can be found in the care plan section) Acute Rehab OT Goals Patient Stated Goal: none stated OT Goal Formulation: Patient unable to participate in goal setting ADL Goals Pt Will Perform Eating: with min guard assist;with adaptive utensils;sitting Pt Will Perform Grooming: with min guard assist;sitting;with caregiver independent in assisting Pt Will Transfer to Toilet: with min guard assist;stand pivot transfer Pt Will Perform Toileting - Clothing Manipulation and hygiene: with min guard assist;sit to/from stand Pt/caregiver will Perform Home Exercise Program: Right Upper extremity;With Supervision;With written HEP provided Additional ADL Goal #1: Pt will perform bed mobility at min A level prior to engaging in ADL activity  OT Frequency: Min 2X/week   Barriers to D/C:  Co-evaluation PT/OT/SLP Co-Evaluation/Treatment: Yes Reason for Co-Treatment: Complexity of the patient's impairments (multi-system involvement);Necessary to address cognition/behavior during functional activity;For patient/therapist safety;To address functional/ADL transfers PT goals addressed during session: Mobility/safety with mobility;Balance OT goals addressed during session: ADL's and self-care;Proper  use of Adaptive equipment and DME;Strengthening/ROM      AM-PAC PT "6 Clicks" Daily Activity     Outcome Measure Help from another person eating meals?: A Little Help from another person taking care of personal grooming?: A Little Help from another person toileting, which includes using toliet, bedpan, or urinal?: A Lot Help from another person bathing (including washing, rinsing, drying)?: A Lot Help from another person to put on and taking off regular upper body clothing?: A Lot Help from another person to put on and taking off regular lower body clothing?: A Lot 6 Click Score: 14   End of Session Nurse Communication: Mobility status;Other (comment)(restraints NOT re-applied)  Activity Tolerance: Patient tolerated treatment well Patient left: in bed;with call bell/phone within reach;with bed alarm set;Other (comment)(all 4 rails up - no restraints re-applied per convo with RN)  OT Visit Diagnosis: Other abnormalities of gait and mobility (R26.89);Unsteadiness on feet (R26.81);Muscle weakness (generalized) (M62.81);Cognitive communication deficit (R41.841);Adult, failure to thrive (R62.7) Symptoms and signs involving cognitive functions: Cerebral infarction                Time: 9480-1655 OT Time Calculation (min): 32 min Charges:  OT General Charges $OT Visit: 1 Visit OT Evaluation $OT Eval Moderate Complexity: 1 Mod  Hulda Humphrey OTR/L [place] 01/28/2018, 11:08 AM

## 2018-01-28 NOTE — Evaluation (Signed)
Physical Therapy Evaluation Patient Details Name: Arloa Prak MRN: [id number] DOB: 1924-06-13 Today's Date: 01/28/2018   History of Present Illness  Tinzlee Craker is a 84 y.o. female with a PMH of Breast CA, CKD,HTN, HLD, Depression and anxiety, and advanced dementia;  chief complaint of aphasia. CT found Acute/subacute left frontal lobe cortical infarct without    Clinical Impression  Ms. Kamrowski admitted with the above listed diagnosis. Unsure of PLOF, as patient presents with difficulty speaking/writing this morning. Patient also with deficits in strength, balance, safety, awareness and functional mobility. Overall, requiring Min A-Mod A for safe mobility for transfers. Did not progress ambulation due to noted high HE (up to 128 - nursing notified). PT to recommend SNF at discharge to progress safe and functional mobility. PT to follow acutely.     Follow Up Recommendations SNF;Supervision/Assistance - 24 hour    Equipment Recommendations  Other (comment)(TBD)    Recommendations for Other Services OT consult     Precautions / Restrictions Precautions Precautions: Fall Restrictions Weight Bearing Restrictions: No      Mobility  Bed Mobility Overal bed mobility: Needs Assistance Bed Mobility: Supine to Sit;Sit to Supine     Supine to sit: Min assist;+2 for physical assistance;+2 for safety/equipment;HOB elevated(use of bed pad to bring hips EOB, assist to elevate trunk) Sit to supine: Min assist;+2 for physical assistance;+2 for safety/equipment;HOB elevated(assist for BLE back into bed, safety in lowering trunk)      Transfers Overall transfer level: Needs assistance Equipment used: 2 person hand held assist;1 person hand held assist Transfers: Sit to/from [facility] Sit to Stand: Min assist;+2 safety/equipment Stand pivot transfers: Min assist;+2 safety/equipment       General transfer comment: Initially Pt min A +2 for safety - multimodal cues  used throughout the session. Pt able to perform transfer back to the bed from [facility] with min A +1; assist for boost and balance - left side of patient seemed more stable (right side has big bruise in arm pit)  Ambulation/Gait             General Gait Details: deferred due to high HR   Stairs            Wheelchair Mobility    Modified Rankin (Stroke Patients Only)       Balance Overall balance assessment: Needs assistance Sitting-balance support: No upper extremity supported;Feet supported Sitting balance-Leahy Scale: Fair Sitting balance - Comments: EOB   Standing balance support: Single extremity supported Standing balance-Leahy Scale: Poor Standing balance comment: dependent on external support                             Pertinent Vitals/Pain Pain Assessment: Faces Faces Pain Scale: Hurts a little bit Pain Location: (general discomfort) Pain Descriptors / Indicators: Discomfort Pain Intervention(s): Limited activity within patient's tolerance;Monitored during session    Home Living Family/patient expects to be discharged to:: Skilled nursing facility Living Arrangements: Children               Additional Comments: Pt unable to communicate during session, and no family present, information gleaned from chart review    Prior Function Level of Independence: Needs assistance   Gait / Transfers Assistance Needed: used a SPC?  ADL's / Homemaking Assistance Needed: had an aide that assisted with ADL  Comments: Pt unable to communicate this session     Hand Dominance   Dominant Hand: Right    Extremity/Trunk  Assessment   Upper Extremity Assessment Upper Extremity Assessment: Generalized weakness;RUE deficits/detail RUE Deficits / Details: grasp grossly 3/5, drift present, increased time and effort for all movement RUE Sensation: WNL(Pt reports it is WFL (nods)) RUE Coordination: decreased fine motor;decreased gross motor(increased time  and effort)    Lower Extremity Assessment Lower Extremity Assessment: Generalized weakness    Cervical / Trunk Assessment Cervical / Trunk Assessment: Kyphotic  Communication   Communication: Expressive difficulties(unable to speak or write at this time)  Cognition Arousal/Alertness: Awake/alert Behavior During Therapy: WFL for tasks assessed/performed Overall Cognitive Status: Difficult to assess                                 General Comments: Pt able to follow one step commands 75% of the time, she got frustrated with her lack of ability to communicate through verbal or written communication. She was aware the she was drooling and took initiative to wipe her mouth herself.       General Comments General comments (skin integrity, edema, etc.): HR up to 128 with limited mobility - nursing notified    Exercises     Assessment/Plan    PT Assessment Patient needs continued PT services  PT Problem List Decreased strength;Decreased activity tolerance;Decreased balance;Decreased mobility;Decreased coordination;Decreased knowledge of use of DME;Decreased safety awareness;Decreased knowledge of precautions       PT Treatment Interventions DME instruction;Gait training;Functional mobility training;Therapeutic activities;Therapeutic exercise;Balance training;Neuromuscular re-education;Patient/family education    PT Goals (Current goals can be found in the Care Plan section)  Acute Rehab PT Goals Patient Stated Goal: none stated PT Goal Formulation: With patient Time For Goal Achievement: 02/11/18 Potential to Achieve Goals: Fair    Frequency Min 2X/week   Barriers to discharge        Co-evaluation PT/OT/SLP Co-Evaluation/Treatment: Yes Reason for Co-Treatment: Complexity of the patient's impairments (multi-system involvement);Necessary to address cognition/behavior during functional activity;For patient/therapist safety;To address functional/ADL transfers PT  goals addressed during session: Mobility/safety with mobility;Balance OT goals addressed during session: ADL's and self-care;Proper use of Adaptive equipment and DME;Strengthening/ROM       AM-PAC PT "6 Clicks" Daily Activity  Outcome Measure Difficulty turning over in bed (including adjusting bedclothes, sheets and blankets)?: A Lot Difficulty moving from lying on back to sitting on the side of the bed? : A Lot Difficulty sitting down on and standing up from a chair with arms (e.g., wheelchair, bedside commode, etc,.)?: Unable Help needed moving to and from a bed to chair (including a wheelchair)?: A Little Help needed walking in hospital room?: A Lot Help needed climbing 3-5 steps with a railing? : A Lot 6 Click Score: 12    End of Session Equipment Utilized During Treatment: Gait belt Activity Tolerance: Patient tolerated treatment well Patient left: in bed;with call bell/phone within reach;with bed alarm set Nurse Communication: Mobility status PT Visit Diagnosis: Unsteadiness on feet (R26.81);Other abnormalities of gait and mobility (R26.89);Muscle weakness (generalized) (M62.81)    Time: 1610-9604 PT Time Calculation (min) (ACUTE ONLY): 35 min   Charges:   PT Evaluation $PT Eval Moderate Complexity: 1 Mod          Lanney Gins, PT, DPT 01/28/18 12:10 PM Pager: [phone]

## 2018-01-28 NOTE — Progress Notes (Signed)
Patient is not cooperating with care, will not take potassium so I will amion provider for possible change of IV fluid to have K in it.

## 2018-01-28 NOTE — Progress Notes (Signed)
Non-Violent Restraints have been applied to both wrists will continue to monitor.

## 2018-01-28 NOTE — Evaluation (Signed)
Clinical/Bedside Swallow Evaluation Patient Details  Name: Kylie Allen MRN: [id number] Date of Birth: 11-23-1923  Today's Date: 01/28/2018 Time: SLP Start Time (ACUTE ONLY): 0957 SLP Stop Time (ACUTE ONLY): 1024 SLP Time Calculation (min) (ACUTE ONLY): 27 min  Past Medical History:  Past Medical History:  Diagnosis Date  . Bradycardia   . Breast cancer (HCC)    hx of  . CKD (chronic kidney disease)   . Depression with anxiety   . Diverticulosis    hx of  . Fatigue   . GERD (gastroesophageal reflux disease)   . H/O: hysterectomy   . History of echocardiogram    Echo (8/15): EF 55-60%, normal wall motion, grade 1 diastolic dysfunction, mild AI, moderate TR, PASP 54 mm Hg  . Hyperlipidemia   . Hypertension    Past Surgical History:  Past Surgical History:  Procedure Laterality Date  . MASTECTOMY Right    HPI:  84 year old woman with medical problems including recurrent history of right-sided breast cancer treated with mastectomy and radiation, advanced dementia with requirement of ADL assistance and at baseline recognizes daughters - spends over 90% of time in bed, anxiety, postherpetic neuralgia, and hypertension who presents with difficulty speaking. History is obtained from the patient's daughters who are present with her at the bedside include these include Drue Dun and Lynnell Grain. CT head 01/27/18 indicated Acute/subacute left frontal lobe cortical infarct without Hemorrhage; MRI pending  Assessment / Plan / Recommendation Clinical Impression   Pt exhibited oropharyngeal dysphagia characterized by overt s/s of aspiration including wet vocal quality intermittently throughout BSE, delayed throat clearing/cough; immediate cough (with thin), decreased oral propulsion/manipulation across all consistencies, impaired mastication (solids), oral holding, delayed initiation of the swallow, multiple swallows and impaired sensation with bolus transition into pharynx with all  consistencies; recommend NPO status and PO trials until pt ready/appropriate for MBS to determine safest diet; weak cough noted and inability to initiate a swallow volitionally, but reflexive swallow noted during BSE; ST will f/u for PO readiness and objective study when pt able as well as f/u for global aphasia/potential apraxia d/t groping/decreased initiation of automatic tasks despite SLP providing max multimodal cueing; thank you for this consult. SLP Visit Diagnosis: Dysphagia, oropharyngeal phase (R13.12)    Aspiration Risk  Severe aspiration risk;Risk for inadequate nutrition/hydration    Diet Recommendation   NPO  Medication Administration: Via alternative means    Other  Recommendations Oral Care Recommendations: Oral care QID   Follow up Recommendations Skilled Nursing facility;24 hour supervision/assistance      Frequency and Duration min 2x/week  1 week       Prognosis Prognosis for Safe Diet Advancement: Fair Barriers to Reach Goals: Cognitive deficits;Severity of deficits      Swallow Study   General Date of Onset: 01/27/18 HPI: 84 year old woman with medical problems including recurrent history of right-sided breast cancer treated with mastectomy and radiation, advanced dementia with requirement of ADL assistance and at baseline recognizes daughters - spends over 90% of time in bed, anxiety, postherpetic neuralgia, and hypertension who presents with difficulty speaking. History is obtained from the patient's daughters who are present with her at the bedside include these include Drue Dun and Lynnell Grain. Type of Study: Bedside Swallow Evaluation Previous Swallow Assessment: (NSSS; passed all components, but pt was referred for BSE) Diet Prior to this Study: NPO Temperature Spikes Noted: No Respiratory Status: Room air History of Recent Intubation: No Behavior/Cognition: Alert;Cooperative;Confused;Requires cueing Oral Cavity Assessment: Excessive  secretions Oral Care Completed by  SLP: Yes Oral Cavity - Dentition: Poor condition;Missing dentition Self-Feeding Abilities: Able to feed self;Needs assist;Needs set up Patient Positioning: Upright in bed Baseline Vocal Quality: Wet;Other (comment)(unable to vocalize consistently) Volitional Cough: Weak Volitional Swallow: Unable to elicit    Oral/Motor/Sensory Function Overall Oral Motor/Sensory Function: Moderate impairment Facial ROM: Reduced right Facial Symmetry: Abnormal symmetry right Facial Strength: Reduced right Facial Sensation: Reduced right Lingual ROM: Reduced right Lingual Symmetry: Abnormal symmetry right Lingual Strength: Reduced Lingual Sensation: Other (Comment)(DTA)   Ice Chips Ice chips: Impaired Presentation: Spoon Oral Phase Impairments: Reduced lingual movement/coordination;Reduced labial seal Oral Phase Functional Implications: Prolonged oral transit;Oral holding Pharyngeal Phase Impairments: Suspected delayed Swallow;Multiple swallows   Thin Liquid Thin Liquid: Impaired Presentation: Spoon Oral Phase Impairments: Reduced lingual movement/coordination Oral Phase Functional Implications: Oral holding;Prolonged oral transit Pharyngeal  Phase Impairments: Suspected delayed Swallow;Wet Vocal Quality;Multiple swallows;Cough - Immediate    Nectar Thick Nectar Thick Liquid: Impaired Presentation: Spoon Oral Phase Impairments: Reduced lingual movement/coordination Oral phase functional implications: Prolonged oral transit;Oral holding Pharyngeal Phase Impairments: Suspected delayed Swallow;Multiple swallows   Honey Thick Honey Thick Liquid: Not tested   Puree Puree: Impaired Presentation: Spoon Oral Phase Impairments: Reduced lingual movement/coordination Oral Phase Functional Implications: Prolonged oral transit;Oral holding Pharyngeal Phase Impairments: Suspected delayed Swallow;Multiple swallows;Throat Clearing - Delayed   Solid     Solid:  Impaired Presentation: Self Fed Oral Phase Impairments: Poor awareness of bolus;Reduced lingual movement/coordination;Impaired mastication Oral Phase Functional Implications: Right lateral sulci pocketing;Prolonged oral transit;Impaired mastication;Oral holding;Oral residue Pharyngeal Phase Impairments: Suspected delayed Swallow;Multiple swallows;Wet Vocal Quality      Kylie Allen, M.S., CCC-SLP 01/28/2018,10:46 AM

## 2018-01-28 NOTE — Progress Notes (Signed)
Pt returned unable to do MRI.

## 2018-01-28 NOTE — Progress Notes (Signed)
PROGRESS NOTE  Kylie Allen HTD:[id number] DOB: 09/07/1923 DOA: 01/27/2018 PCP: Shawnee Knapp, MD  HPI/Recap of past 24 hours:  Alert, aphasic, right side neglect,  She does not appear to be in distress, she does not have fever, does not appear in pain  Daughter Kylie Allen who is a retired Immunologist is at bedside W[place]er states patient has moved in with her for  The last 33months Patient has not showed for the last two yrs, she refused taking shower or getting bath even after moving in with Kylie Allen is now aware of the wound on her axilla and her back  [name] state patient and her another daughter [name] are Jehovah witness    Assessment/Plan: Active Problems:   CVA (cerebral vascular accident) ([place])  Acute CVA -she has right sided neglect, aphasia, she did not pass swallow eval -neurology consulted, will follow recommendations -I have discussed with [name] that patient carries a poor prognosis, if she can not swallow, she agrees to meet with palliative care She is aware of the possibility that patient might need hospice care if she can not swallow   Hypokalemia: Replace k through IV Give iv mag Repeat lab in am  Right axilla wound extend to her back,  Unclear duration, she does not have fever, no leukocytosis, she does not appear in significant pain [name] reports patient has remote h/o breast cancer on the right side she does not know the detail  I told [name] my concerns that this might be cancer, but even if this is cancer , patient is too frail, she will not be a candidate for aggressive cancer treatment. Daughter Kylie Allen agree to not doing further imaging or biopsy, will focus on local wound care, pain control.   FTT: over all poor prognosis    Code Status: DNR ,confirmed with [name] at bedside  Family Communication: patient and daughter  Disposition Plan: need palliative care input, likely progress to hospice after palliative care  meeting with the family   Consultants:  Neurology   Palliative care  Wound care  Procedures:  none  Antibiotics:  none   Objective: BP (!) 162/77 (BP Location: Right Arm)   Pulse (!) 105   Temp 98.6 F (37 C) (Axillary)   Resp 20   Ht 5\' 3"  (1.6 m)   Wt 53 kg   SpO2 99%   BMI 20.70 kg/m   Intake/Output Summary (Last 24 hours) at 01/28/2018 1439 Last data filed at 01/28/2018 0300 Gross per 24 hour  Intake 231.06 ml  Output -  Net 231.06 ml   Filed Weights   01/27/18 2055  Weight: 53 kg    Exam: Patient is examined daily including today on 01/28/2018, exams remain the same as of yesterday except that has changed    General:  NAD, alert, following commands on the left side, right side neglect, aphasia  Cardiovascular: RRR  Respiratory: CTABL  Abdomen: Soft/ND/NT, positive BS  Musculoskeletal: right axilla wound extends to her back, no odor, no active drainage, no blood ( see pics below)  Neuro: alert, following commands on the left side, right side neglect, aphasia          Data Reviewed: Basic Metabolic Panel: Recent Labs  Lab 01/27/18 1624 01/27/18 1957 01/28/18 0339  NA 140 143 143  K 4.8 2.9* 2.8*  CL 102 102 103  CO2  --  23 27  GLUCOSE 113* 107* 112*  BUN 17 12 10   CREATININE 0.80 0.97 0.98  CALCIUM  --  9.0 8.8*   Liver Function Tests: Recent Labs  Lab 01/27/18 1957  AST 18  ALT 8  ALKPHOS 46  BILITOT 1.1  PROT 6.1*  ALBUMIN 2.8*   No results for input(s): LIPASE, AMYLASE in the last 168 hours. No results for input(s): AMMONIA in the last 168 hours. CBC: Recent Labs  Lab 01/27/18 1624 01/27/18 1957 01/28/18 0339  WBC  --  9.3 9.0  NEUTROABS  --  6.8  --   HGB 12.2 12.0 11.2*  HCT 36.0 38.1 34.3*  MCV  --  99.0 95.5  PLT  --  283 309   Cardiac Enzymes:   No results for input(s): CKTOTAL, CKMB, CKMBINDEX, TROPONINI in the last 168 hours. BNP (last 3 results) No results for input(s): BNP in the last 8760  hours.  ProBNP (last 3 results) No results for input(s): PROBNP in the last 8760 hours.  CBG: No results for input(s): GLUCAP in the last 168 hours.  No results found for this or any previous visit (from the past 240 hour(s)).   Studies: Ct Angio Head W Or Wo Contrast  Result Date: 01/27/2018 CLINICAL DATA:  Acute onset of right facial droop and aphasia beginning 8 hours ago. EXAM: CT ANGIOGRAPHY HEAD AND NECK TECHNIQUE: Multidetector CT imaging of the head and neck was performed using the standard protocol during bolus administration of intravenous contrast. Multiplanar CT image reconstructions and MIPs were obtained to evaluate the vascular anatomy. Carotid stenosis measurements (when applicable) are obtained utilizing NASCET criteria, using the distal internal carotid diameter as the denominator. CONTRAST:  75mL ISOVUE-370 IOPAMIDOL (ISOVUE-370) INJECTION 76% COMPARISON:  None. FINDINGS: CT HEAD FINDINGS Brain: Acute/subacute nonhemorrhagic infarct is present in the posterior left frontal lobe with some involvement of the precentral gyrus. There is partial effacement of the sulci without midline shift or mass effect on the ventral. No other acute or subacute infarct is present. A remote lacunar infarct is present in the right caudate head. Mild atrophy and white matter changes are otherwise within normal limits for age. The ventricles are proportionate to the degree of atrophy. No significant extra-axial fluid collection is present. The brainstem is normal. Remote lacunar infarcts are present in the right cerebellum. Vascular: A right cages are present within the cavernous internal carotid arteries. There is no hyperdense vessel. Skull: Calvarium is intact. No focal lytic or blastic lesions are present. Asymmetric degenerative changes are noted at the left TMJ. Sinuses: The paranasal sinuses and mastoid air cells are clear. Orbits: Bilateral lens replacements are present. Globes and orbits are within  normal limits bilaterally. Review of the MIP images confirms the above findings CTA NECK FINDINGS Aortic arch: Atherosclerotic calcifications are present at the aortic arch without focal stenosis of the great vessel origins. There is no aneurysm. Right carotid system: The right common carotid artery is within normal limits. Mild atherosclerotic changes are present at bifurcation without significant stenosis. There is moderate tortuosity of the cervical right ICA below the skull base. No significant stenosis present. Left carotid system: Left common carotid artery is mildly tortuous. Minimal atherosclerotic changes are present the left carotid bifurcation. There is mild irregularity of the distal left cervical internal carotid artery with some beading no significant focal stenosis. Moderate tortuosity is present just below the skull base. Vertebral arteries: The vertebral arteries are patent bilaterally. The left vertebral artery is dominant. Both vertebral arteries originate from the subclavian arteries without significant stenosis. There is tortuosity in the neck. No focal stenosis is present in  the neck. Skeleton: Vertebral body heights alignment maintained. No focal lytic or blastic lesions are present. Other neck: The soft tissues the neck are otherwise unremarkable. Thyroid is normal. Salivary glands are within normal limits bilaterally. Multinodular goiter is present. No dominant lesion is present. Upper chest: A 4 mm right upper lobe of lung nodule is noted. No other focal nodule or mass lesion is present. Thoracic inlet is within normal limits. Review of the MIP images confirms the above findings CTA HEAD FINDINGS Anterior circulation: Atherosclerotic calcifications are present within the cavernous internal carotid arteries bilaterally. There is no significant stenosis from the skull base through the ICA termini bilaterally. The A1 and M1 great vessels are normal. Anterior communicating artery is patent. The  MCA bifurcations are intact. There is asymmetric attenuation of left MCA branch vessels within the infarct territory. No discrete proximal stenosis or branch vessel occlusion is evident. Posterior circulation: The left vertebral artery is the dominant vessel. The basilar artery is normal. Right posterior cerebral artery is of fetal type. A mild right P2 segment stenosis is present. No other significant proximal stenosis or occlusion is present. Venous sinuses: Dural sinuses are patent. The straight sinus and deep cerebral veins are intact. Cortical veins are within normal limits. Anatomic variants: Fetal type right posterior cerebral artery. Delayed phase: Delayed phase images demonstrates no pathologic enhancement. Review of the MIP images confirms the above findings IMPRESSION: 1. Acute/subacute left frontal lobe cortical infarct without hemorrhage. 2. Focal asymmetric decrease in vascularity than left MCA branches associated with the acute infarct. No associated large vessel occlusion is present. 3. Focal vessel irregularity and beading in the left cervical internal carotid artery raises concern for fibromuscular dysplasia. 4. Tortuosity of the distal internal carotid arteries bilaterally. This is nonspecific, but most commonly seen in the setting of chronic hypertension. 5. Mild diffuse distal small vessel disease is present without a significant proximal large vessel occlusion. 6. Atrophy and white matter changes are otherwise within normal limits for age. 7.  Aortic Atherosclerosis (ICD10-I70.0). 8. 4 mm right upper lobe lung nodule. No follow-up needed if patient is low-risk. Non-contrast chest CT can be considered in 12 months if patient is high-risk. This recommendation follows the consensus statement: Guidelines for Management of Incidental Pulmonary Nodules Detected on CT Images: From the Fleischner Society 2017; Radiology 2017; 284:228-243. These results were called by telephone at the time of  interpretation on 01/27/2018 at 6:13pm to Dr. Leonel Ramsay, who verbally acknowledged these results. Electronically Signed   By: San Morelle M.D.   On: 01/27/2018 18:39   Ct Angio Neck W Or Wo Contrast  Result Date: 01/27/2018 CLINICAL DATA:  Acute onset of right facial droop and aphasia beginning 8 hours ago. EXAM: CT ANGIOGRAPHY HEAD AND NECK TECHNIQUE: Multidetector CT imaging of the head and neck was performed using the standard protocol during bolus administration of intravenous contrast. Multiplanar CT image reconstructions and MIPs were obtained to evaluate the vascular anatomy. Carotid stenosis measurements (when applicable) are obtained utilizing NASCET criteria, using the distal internal carotid diameter as the denominator. CONTRAST:  30mL ISOVUE-370 IOPAMIDOL (ISOVUE-370) INJECTION 76% COMPARISON:  None. FINDINGS: CT HEAD FINDINGS Brain: Acute/subacute nonhemorrhagic infarct is present in the posterior left frontal lobe with some involvement of the precentral gyrus. There is partial effacement of the sulci without midline shift or mass effect on the ventral. No other acute or subacute infarct is present. A remote lacunar infarct is present in the right caudate head. Mild atrophy and white matter changes  are otherwise within normal limits for age. The ventricles are proportionate to the degree of atrophy. No significant extra-axial fluid collection is present. The brainstem is normal. Remote lacunar infarcts are present in the right cerebellum. Vascular: A right cages are present within the cavernous internal carotid arteries. There is no hyperdense vessel. Skull: Calvarium is intact. No focal lytic or blastic lesions are present. Asymmetric degenerative changes are noted at the left TMJ. Sinuses: The paranasal sinuses and mastoid air cells are clear. Orbits: Bilateral lens replacements are present. Globes and orbits are within normal limits bilaterally. Review of the MIP images confirms the  above findings CTA NECK FINDINGS Aortic arch: Atherosclerotic calcifications are present at the aortic arch without focal stenosis of the great vessel origins. There is no aneurysm. Right carotid system: The right common carotid artery is within normal limits. Mild atherosclerotic changes are present at bifurcation without significant stenosis. There is moderate tortuosity of the cervical right ICA below the skull base. No significant stenosis present. Left carotid system: Left common carotid artery is mildly tortuous. Minimal atherosclerotic changes are present the left carotid bifurcation. There is mild irregularity of the distal left cervical internal carotid artery with some beading no significant focal stenosis. Moderate tortuosity is present just below the skull base. Vertebral arteries: The vertebral arteries are patent bilaterally. The left vertebral artery is dominant. Both vertebral arteries originate from the subclavian arteries without significant stenosis. There is tortuosity in the neck. No focal stenosis is present in the neck. Skeleton: Vertebral body heights alignment maintained. No focal lytic or blastic lesions are present. Other neck: The soft tissues the neck are otherwise unremarkable. Thyroid is normal. Salivary glands are within normal limits bilaterally. Multinodular goiter is present. No dominant lesion is present. Upper chest: A 4 mm right upper lobe of lung nodule is noted. No other focal nodule or mass lesion is present. Thoracic inlet is within normal limits. Review of the MIP images confirms the above findings CTA HEAD FINDINGS Anterior circulation: Atherosclerotic calcifications are present within the cavernous internal carotid arteries bilaterally. There is no significant stenosis from the skull base through the ICA termini bilaterally. The A1 and M1 great vessels are normal. Anterior communicating artery is patent. The MCA bifurcations are intact. There is asymmetric attenuation of  left MCA branch vessels within the infarct territory. No discrete proximal stenosis or branch vessel occlusion is evident. Posterior circulation: The left vertebral artery is the dominant vessel. The basilar artery is normal. Right posterior cerebral artery is of fetal type. A mild right P2 segment stenosis is present. No other significant proximal stenosis or occlusion is present. Venous sinuses: Dural sinuses are patent. The straight sinus and deep cerebral veins are intact. Cortical veins are within normal limits. Anatomic variants: Fetal type right posterior cerebral artery. Delayed phase: Delayed phase images demonstrates no pathologic enhancement. Review of the MIP images confirms the above findings IMPRESSION: 1. Acute/subacute left frontal lobe cortical infarct without hemorrhage. 2. Focal asymmetric decrease in vascularity than left MCA branches associated with the acute infarct. No associated large vessel occlusion is present. 3. Focal vessel irregularity and beading in the left cervical internal carotid artery raises concern for fibromuscular dysplasia. 4. Tortuosity of the distal internal carotid arteries bilaterally. This is nonspecific, but most commonly seen in the setting of chronic hypertension. 5. Mild diffuse distal small vessel disease is present without a significant proximal large vessel occlusion. 6. Atrophy and white matter changes are otherwise within normal limits for age. 7.  Aortic Atherosclerosis (ICD10-I70.0). 8. 4 mm right upper lobe lung nodule. No follow-up needed if patient is low-risk. Non-contrast chest CT can be considered in 12 months if patient is high-risk. This recommendation follows the consensus statement: Guidelines for Management of Incidental Pulmonary Nodules Detected on CT Images: From the Fleischner Society 2017; Radiology 2017; 284:228-243. These results were called by telephone at the time of interpretation on 01/27/2018 at 6:13pm to Dr. Leonel Ramsay, who verbally  acknowledged these results. Electronically Signed   By: San Morelle M.D.   On: 01/27/2018 18:39   Dg Chest Port 1 View  Result Date: 01/27/2018 CLINICAL DATA:  Weakness EXAM: PORTABLE CHEST 1 VIEW COMPARISON:  09/21/2012 FINDINGS: There is no focal parenchymal opacity. There is no pleural effusion or pneumothorax. There is stable cardiomegaly. 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 The osseous structures are unremarkable. IMPRESSION: No active disease. Electronically Signed   By: Kathreen Devoid   On: 01/27/2018 16:54    Scheduled Meds: . aspirin  300 mg Rectal Daily   Or  . aspirin  325 mg Oral Daily  . atorvastatin  40 mg Oral q1800  . enoxaparin (LOVENOX) injection  40 mg Subcutaneous Q24H  . potassium chloride  40 mEq Oral Once    Continuous Infusions:   Time spent: 35 mins, case discussed with neurology and wound care I have personally reviewed and interpreted on  01/28/2018 daily labs, tele strips, imagings as discussed above under date review session and assessment and plans.  I reviewed all nursing notes, pharmacy notes, consultant notes,  vitals, pertinent old records  I have discussed plan of care as described above with RN , patient and family on 01/28/2018   Florencia Reasons MD, PhD  [facility] Pager [phone]. If 7PM-7AM, please contact night-coverage at [email or web address], password [facility] 01/28/2018, 2:39 PM  LOS: 1 day

## 2018-01-28 NOTE — Progress Notes (Signed)
01/28/18 @ 22:02  Pt taking off clothes and attempting to get out of bed, will not stay still. . Unable to scan at this time.

## 2018-01-28 NOTE — Consult Note (Signed)
[facility] Nurse wound consult note I spoke with Dr. Florencia Reasons via telephone to discuss the right axillary area.  Dr. Erlinda Hong will investigate and manager further according to goals and needs.  Val Riles, RN, MSN, CWOCN, CNS-BC, pager [phone]

## 2018-01-28 NOTE — Progress Notes (Signed)
Pt back to room from procedure. P. Amo Jolon Degante RN 

## 2018-01-28 NOTE — Progress Notes (Signed)
Pt transported off unit to MRI and ECHO. Delia Heady RN

## 2018-01-28 NOTE — Evaluation (Signed)
Speech Language Pathology Evaluation Patient Details Name: Kylie Allen MRN: [id number] DOB: 1923-08-03 Today's Date: 01/28/2018 Time: 7616-0737 SLP Time Calculation (min) (ACUTE ONLY): 27 min  Problem List:  Patient Active Problem List   Diagnosis Date Noted  . CVA (cerebral vascular accident) ([place]) 01/27/2018  . Anxiety state 01/22/2017  . PVC's (premature ventricular contractions) 12/30/2015  . SVT (supraventricular tachycardia) ([place]) 12/30/2015  . Medication noncompliance due to cognitive impairment 11/16/2015  . Dementia 08/26/2014  . Bradycardia 04/19/2011  . Hyperlipidemia 05/10/2009  . DEPRESSION 05/10/2009  . MIGRAINE HEADACHE 05/10/2009  . Essential hypertension 05/10/2009  . GERD 05/10/2009  . Chronic kidney disease 05/10/2009  . BREAST CANCER, HX OF 05/10/2009  . SYNCOPE, HX OF 05/10/2009  . DIVERTICULITIS, HX OF 05/10/2009  . Personal history of urinary disorder 05/10/2009   Past Medical History:  Past Medical History:  Diagnosis Date  . Bradycardia   . Breast cancer (HCC)    hx of  . CKD (chronic kidney disease)   . Depression with anxiety   . Diverticulosis    hx of  . Fatigue   . GERD (gastroesophageal reflux disease)   . H/O: hysterectomy   . History of echocardiogram    Echo (8/15): EF 55-60%, normal wall motion, grade 1 diastolic dysfunction, mild AI, moderate TR, PASP 54 mm Hg  . Hyperlipidemia   . Hypertension    Past Surgical History:  Past Surgical History:  Procedure Laterality Date  . MASTECTOMY Right    HPI:  84 year old woman with medical problems including recurrent history of right-sided breast cancer treated with mastectomy and radiation, advanced dementia with requirement of ADL assistance and at baseline recognizes daughters - spends over 90% of time in bed, anxiety, postherpetic neuralgia, and hypertension who presents with difficulty speaking. History is obtained from the patient's daughters who are present with her at the  bedside include these include Drue Dun and Lynnell Grain. CT head on 01/27/18 indicated Acute/subacute left frontal lobe cortical infarct without Hemorrhage; ;MRI pending Assessment / Plan / Recommendation Clinical Impression   Pt presents with global aphasia characterized by ability to follow simple 1-step commands with 90% accuracy, but unable to follow 2-step commands withmax verbal/visual cueing (could be d/t PLOF); pt groping/aware during attempts to communicate and primary mode of communication via gesturing with head nod/shake and/or pointing and utilizing non-verbal communication such as smiling/grimacing, etc. In response to questions; pt attempted automatic tasks such as counting, stating alphabet, singing with max multimodal cues provided by SLP without success; Y/N questions 80% accurate with simple questions only; pt with baseline advanced dementia per chart review, so cognitive functioning unable to be assessed at this time d/t severe aphasia, but suspect impairments persist and/or are exacerbated by recent CVA. No family available during SLE to confirm/deny previous cognitive status; ST will f/u for speech/language deficits and dysphagia while in acute setting.    SLP Assessment  SLP Recommendation/Assessment: Patient needs continued Speech Language Pathology Services SLP Visit Diagnosis: Dysphagia, oropharyngeal phase (R13.12);Aphasia (R47.01);Apraxia (R48.2);Cognitive communication deficit (R41.841)    Follow Up Recommendations  Skilled Nursing facility;24 hour supervision/assistance    Frequency and Duration min 2x/week  1 week      SLP Evaluation Cognition  Overall Cognitive Status: Difficult to assess Arousal/Alertness: Awake/alert Orientation Level: Other (comment)(DTA) Comments: Cognitive function DTA d/t aphasia/? apraxia       Comprehension  Auditory Comprehension Overall Auditory Comprehension: Impaired Yes/No Questions: Within Functional Limits Commands:  Impaired Two Step Basic Commands: 25-49% accurate Conversation: Other (  comment)(DTA d/t aphasia; pt primarily uses gestures) EffectiveTechniques: Visual/Gestural cues;Slowed speech;Repetition Visual Recognition/Discrimination Discrimination: Not tested Reading Comprehension Reading Status: Not tested    Expression Expression Primary Mode of Expression: Nonverbal - gestures Verbal Expression Overall Verbal Expression: Impaired Initiation: Impaired Automatic Speech: Counting;Singing Level of Generative/Spontaneous Verbalization: (Attempted repetition of words without success) Repetition: Impaired Level of Impairment: Word level Naming: Impairment Responsive: 0-25% accurate Confrontation: Impaired Convergent: 0-24% accurate Divergent: 0-24% accurate Verbal Errors: Aware of errors Pragmatics: Unable to assess Interfering Components: Speech intelligibility;Premorbid deficit Non-Verbal Means of Communication: Gestures Written Expression Dominant Hand: Right Written Expression: Not tested   Oral / Motor  Oral Motor/Sensory Function Overall Oral Motor/Sensory Function: Moderate impairment Facial ROM: Reduced right Facial Symmetry: Abnormal symmetry right Facial Strength: Reduced right Facial Sensation: Reduced right Lingual ROM: Reduced right Lingual Symmetry: Abnormal symmetry right Lingual Strength: Reduced Lingual Sensation: Other (Comment)(DTA) Motor Speech Overall Motor Speech: Impaired Respiration: Within functional limits Phonation: Other (comment)(DNT) Resonance: (UTA) Articulation: Impaired Level of Impairment: Word Intelligibility: Unable to assess (comment) Motor Planning: Impaired Level of Impairment: Word Motor Speech Errors: Groping for words;Aware   GO                    Elvina Sidle 01/28/2018, 11:04 AM

## 2018-01-28 NOTE — Consult Note (Signed)
[place] Nurse wound consult note Assessment of right axilla completed in [facility] 3W37.  No family present. Reason for Consult: Right axillary wound Wound type: There is swelling, purple-ish discoloration, a dried/burned appearance to the central area of the wound,  brown drainage, and a fissue deep in right axilla.  Also, there is bruising that extends to the patient's back on the right side. The areas impacted appear painful to touch.  The etiology of this area is not at all clear to me.  Review of record indicates she has been treated for right breast cancer.  I question if this is an area that previously received radiation therapy.  I also question if this area represents some type of metastatic disease process or perhaps an aneurysm.  I will place topical therapy orders and reach out to the primary MD for discussion of the area.  Plan of care:  Gentle cleansing with saline, pat dry, apply a piece of silver hydrofiber (Aquacel Ag+, Kellie Simmering [phone]) to the crease.  Cover with gauze.  Change daily. Monitor the wound area(s) for worsening of condition such as: Signs/symptoms of infection,  Increase in size,  Development of or worsening of odor, Development of pain, or increased pain at the affected locations.  Notify the medical team if any of these develop.  Thank you for the consult.  Discussed plan of care with the patient and bedside nurse.  [place] nurse will not follow at this time.  Please re-consult the [place] team if needed.  Val Riles, RN, MSN, CWOCN, CNS-BC, pager [phone]

## 2018-01-28 NOTE — Care Management Note (Signed)
Case Management Note  Patient Details  Name: Kylie Allen MRN: [id number] Date of Birth: 08/12/1923  Subjective/Objective:     Pt in to r/o CVA. She is from home with her daughter.  PCP:  Dr Brigitte Pulse Insurance: [place]: [name]               Action/Plan: Awaiting PT/OT evals. CM following for d/c disposition.   Contact: Daughter: [name]::  [phone]  [phone]     Expected Discharge Date:                  Expected Discharge Plan:     In-House Referral:     Discharge planning Services     Post Acute Care Choice:    Choice offered to:     DME Arranged:    DME Agency:     HH Arranged:    HH Agency:     Status of Service:  In process, will continue to follow  If discussed at Long Length of Stay Meetings, dates discussed:    Additional Comments:  Pollie Friar, RN 01/28/2018, 1:40 PM

## 2018-01-28 NOTE — Progress Notes (Signed)
Pt went down for MRI Ativan given.

## 2018-01-28 NOTE — Progress Notes (Addendum)
STROKE TEAM PROGRESS NOTE   SUBJECTIVE (INTERVAL HISTORY) [name]  Pt global aphasia, with right neglect and hemianopia. Left UE mildly weakness but left facial droop prominent. Pt has wound deep at right armpit. Wound care team on board.   OBJECTIVE Vitals:   01/28/18 0455 01/28/18 0738 01/28/18 0849 01/28/18 1230  BP: (!) 148/120 (!) 161/97 (!) 156/86 (!) 162/77  Pulse: (!) 108 (!) 104 100 (!) 105  Resp: 18 18 20 20   Temp: 98.5 F (36.9 C) 98.5 F (36.9 C)  98.6 F (37 C)  TempSrc: Oral Oral  Axillary  SpO2: 97% 97% 99% 99%  Weight:      Height:        CBC:  Recent Labs  Lab 01/27/18 1957 01/28/18 0339  WBC 9.3 9.0  NEUTROABS 6.8  --   HGB 12.0 11.2*  HCT 38.1 34.3*  MCV 99.0 95.5  PLT 283 875    Basic Metabolic Panel:  Recent Labs  Lab 01/27/18 1957 01/28/18 0339  NA 143 143  K 2.9* 2.8*  CL 102 103  CO2 23 27  GLUCOSE 107* 112*  BUN 12 10  CREATININE 0.97 0.98  CALCIUM 9.0 8.8*    Lipid Panel:     Component Value Date/Time   CHOL 222 (H) 01/28/2018 0339   TRIG 94 01/28/2018 0339   HDL 44 01/28/2018 0339   CHOLHDL 5.0 01/28/2018 0339   VLDL 19 01/28/2018 0339   LDLCALC 159 (H) 01/28/2018 0339   HgbA1c: No results found for: HGBA1C Urine Drug Screen: No results found for: LABOPIA, COCAINSCRNUR, LABBENZ, AMPHETMU, THCU, LABBARB  Alcohol Level     Component Value Date/Time   ETH <10 01/27/2018 1614    IMAGING I have personally reviewed the radiological images below and agree with the radiology interpretations.  Ct Angio Head W Or Wo Contrast Ct Angio Neck W Or Wo Contrast 01/27/2018 IMPRESSION:  1. Acute/subacute left frontal lobe cortical infarct without hemorrhage.  2. Focal asymmetric decrease in vascularity than left MCA branches associated with the acute infarct. No associated large vessel occlusion is present.  3. Focal vessel irregularity and beading in the left cervical internal carotid artery raises concern for  fibromuscular dysplasia.  4. Tortuosity of the distal internal carotid arteries bilaterally. This is nonspecific, but most commonly seen in the setting of chronic hypertension.  5. Mild diffuse distal small vessel disease is present without a significant proximal large vessel occlusion.  6. Atrophy and white matter changes are otherwise within normal limits for age.  7.  Aortic Atherosclerosis (ICD10-I70.0).  8. 4 mm right upper lobe lung nodule. No follow-up needed if patient is low-risk. Non-contrast chest CT can be considered in 12 months if patient is high-risk.   MRI Brain Wo Contrast - pending  Transthoracic Echocardiogram - Left ventricle: LVEF Is 50 to 55% with basal inferior   hypokinesis. The cavity size was normal. Wall thickness was   normal. - Aortic valve: There was trivial regurgitation. - Pulmonary arteries: PA peak pressure: 35 mm Hg (S).   PHYSICAL EXAM  Temp:  [98.5 F (36.9 C)-98.9 F (37.2 C)] 98.6 F (37 C) (08/12 1230) Pulse Rate:  [51-115] 105 (08/12 1230) Resp:  [15-31] 20 (08/12 1230) BP: (148-202)/(72-134) 162/77 (08/12 1230) SpO2:  [95 %-100 %] 99 % (08/12 1230) Weight:  [53 kg] 53 kg (08/11 2055)  General - cachetic, well developed, mildly agitated and restless.  Ophthalmologic - fundi not visualized due to noncooperation.  Cardiovascular - Regular rate and rhythm.  Neuro - awake, alert, global aphasia, non verbal, but able to follow central commands only, but not peripheral commands. Right side neglect, not blinking to visual threat on the right. PERRL, left gaze preference but able to cross midline. Able to track in the left visual field. Right facial droop, tongue midline. LUE and LLE 4/5, RLE 4/5 but RUE 4-/5 could be partly due to right large deep wound at right armpit. DTR 1+ and no babinski. Sensation, coordination and gait not tested   ASSESSMENT/PLAN Kylie Allen is a 84 y.o. female with history of breast CA, CKD,HTN, HLD,  depression, anxiety, and advanced dementia  presenting with aphasia. She did not receive IV t-PA due to late presentation.  Stroke: acute/subacute left frontal lobe cortical infarct - embolic - unknown etiology  Resultant  Aphasia, right facial droop, right arm weakness, right neglect  CT head - Acute/subacute left frontal lobe cortical infarct   MRI head - pending  CTA H&N - unremarkable  2D Echo - EF 50-55%  LDL - 159  HgbA1c - pending  VTE prophylaxis - Lovenox  Diet - NPO - NS @ 75 cc/hr  aspirin 81 mg daily prior to admission, now on aspirin 300mg  PR  Ongoing aggressive stroke risk factor management  Therapy recommendations:  pending  Disposition:  Pending  Given pt advance age, global aphasia, dysphagia, advanced dementia, would not recommend PEG but recommend palliative care involvement. Discussed with Dr. Florencia Reasons.  Hypertension  Stable on the high end . Permissive hypertension (OK if < 220/120) but gradually normalize in 5-7 days . Long-term BP goal normotensive  Hyperlipidemia  Lipid lowering medication PTA:  none  LDL 159, goal < 70  Current lipid lowering medication: Add Lipitor 40 mg daily  Continue statin at discharge  Dysphagia  Did not pass swallow  NPO  Not recommend PEG - poor prognosis  Consider palliative care involvement  Other Stroke Risk Factors  Advanced age  Former cigarette smoker - quit  Family hx stroke (sister)  Other Active Problems  Hypokalemia 2.9->2.8  4 mm right upper lobe lung nodule.   Dementia   Hospital day # 1  Rosalin Hawking, MD PhD Stroke Neurology 01/28/2018 5:59 PM    To contact Stroke Continuity provider, please refer to [email or web address] After hours, contact General Neurology

## 2018-01-28 NOTE — Progress Notes (Signed)
  Echocardiogram 2D Echocardiogram has been performed.  Jennette Dubin 01/28/2018, 2:41 PM

## 2018-01-29 DIAGNOSIS — T17908A Unspecified foreign body in respiratory tract, part unspecified causing other injury, initial encounter: Secondary | ICD-10-CM

## 2018-01-29 DIAGNOSIS — Z515 Encounter for palliative care: Secondary | ICD-10-CM

## 2018-01-29 DIAGNOSIS — R1310 Dysphagia, unspecified: Secondary | ICD-10-CM

## 2018-01-29 DIAGNOSIS — L89112 Pressure ulcer of right upper back, stage 2: Secondary | ICD-10-CM

## 2018-01-29 DIAGNOSIS — I639 Cerebral infarction, unspecified: Secondary | ICD-10-CM

## 2018-01-29 LAB — BASIC METABOLIC PANEL
Anion gap: 16 — ABNORMAL HIGH (ref 5–15)
BUN: 7 mg/dL — ABNORMAL LOW (ref 8–23)
CALCIUM: 8.6 mg/dL — AB (ref 8.9–10.3)
CO2: 21 mmol/L — ABNORMAL LOW (ref 22–32)
CREATININE: 0.96 mg/dL (ref 0.44–1.00)
Chloride: 103 mmol/L (ref 98–111)
GFR, EST AFRICAN AMERICAN: 58 mL/min — AB (ref >60)
GFR, EST NON AFRICAN AMERICAN: 50 mL/min — AB (ref >60)
GLUCOSE: 117 mg/dL — AB (ref 70–99)
Potassium: 3.3 mmol/L — ABNORMAL LOW (ref 3.5–5.1)
Sodium: 140 mmol/L (ref 135–145)

## 2018-01-29 LAB — HEMOGLOBIN A1C
Hgb A1c MFr Bld: 5.5 % (ref 4.8–5.6)
Mean Plasma Glucose: 111 mg/dL

## 2018-01-29 LAB — GLUCOSE, CAPILLARY: GLUCOSE-CAPILLARY: 100 mg/dL — AB (ref 70–99)

## 2018-01-29 LAB — MAGNESIUM: Magnesium: 1.7 mg/dL (ref 1.7–2.4)

## 2018-01-29 MED ORDER — LORAZEPAM 2 MG/ML PO CONC: 1.0000 mg | ORAL | Status: DC | PRN

## 2018-01-29 MED ORDER — FAMOTIDINE IN NACL 20-0.9 MG/50ML-% IV SOLN
20.0000 mg | INTRAVENOUS | Status: DC
Start: 2018-01-29 — End: 2018-01-31
  Administered 2018-01-29 – 2018-01-31 (×3): 20 mg via INTRAVENOUS
  Filled 2018-01-29 (×3): qty 50

## 2018-01-29 MED ORDER — GLYCOPYRROLATE 0.2 MG/ML IJ SOLN
0.3000 mg | Freq: Three times a day (TID) | INTRAMUSCULAR | Status: DC
  Administered 2018-01-29 – 2018-01-30 (×3): 0.3 mg via INTRAVENOUS
  Filled 2018-01-29 (×3): qty 2

## 2018-01-29 MED ORDER — LORAZEPAM 2 MG/ML IJ SOLN: 1.0000 mg | INTRAMUSCULAR | Status: DC | PRN

## 2018-01-29 MED ORDER — HALOPERIDOL 0.5 MG PO TABS: 0.5000 mg | ORAL_TABLET | ORAL | Status: DC | PRN

## 2018-01-29 MED ORDER — FAMOTIDINE IN NACL 20-0.9 MG/50ML-% IV SOLN: 20.0000 mg | Freq: Two times a day (BID) | INTRAVENOUS | Status: DC

## 2018-01-29 MED ORDER — ONDANSETRON 4 MG PO TBDP: 4.0000 mg | ORAL_TABLET | Freq: Four times a day (QID) | ORAL | Status: DC | PRN

## 2018-01-29 MED ORDER — GLYCOPYRROLATE 0.2 MG/ML IJ SOLN: 0.2000 mg | INTRAMUSCULAR | Status: DC | PRN

## 2018-01-29 MED ORDER — ONDANSETRON HCL 4 MG/2ML IJ SOLN
4.0000 mg | Freq: Four times a day (QID) | INTRAMUSCULAR | Status: DC | PRN
  Administered 2018-01-29 (×2): 4 mg via INTRAVENOUS
  Filled 2018-01-29 (×2): qty 2

## 2018-01-29 MED ORDER — HALOPERIDOL LACTATE 5 MG/ML IJ SOLN: 2.0000 mg | INTRAMUSCULAR | Status: DC | PRN

## 2018-01-29 MED ORDER — MORPHINE SULFATE (CONCENTRATE) 10 MG/0.5ML PO SOLN
5.0000 mg | ORAL | Status: DC | PRN
  Administered 2018-01-29: 5 mg via SUBLINGUAL

## 2018-01-29 MED ORDER — ONDANSETRON HCL 4 MG/2ML IJ SOLN: 4.0000 mg | Freq: Four times a day (QID) | INTRAMUSCULAR | Status: DC | PRN

## 2018-01-29 MED ORDER — HALOPERIDOL LACTATE 2 MG/ML PO CONC: 0.5000 mg | ORAL | Status: DC | PRN

## 2018-01-29 MED ORDER — MORPHINE SULFATE (CONCENTRATE) 10 MG/0.5ML PO SOLN
5.0000 mg | ORAL | Status: DC | PRN
  Filled 2018-01-29: qty 0.5

## 2018-01-29 MED ORDER — BIOTENE DRY MOUTH MT LIQD: 15.0000 mL | OROMUCOSAL | Status: DC | PRN

## 2018-01-29 MED ORDER — LORAZEPAM 0.5 MG PO TABS
0.5000 mg | ORAL_TABLET | Freq: Two times a day (BID) | ORAL | Status: DC
  Administered 2018-01-29 – 2018-01-31 (×4): 0.5 mg via ORAL
  Filled 2018-01-29 (×4): qty 1

## 2018-01-29 MED ORDER — MORPHINE SULFATE (PF) 2 MG/ML IV SOLN
2.0000 mg | INTRAVENOUS | Status: DC | PRN
  Administered 2018-01-30 – 2018-01-31 (×4): 2 mg via INTRAVENOUS
  Filled 2018-01-29 (×4): qty 1

## 2018-01-29 MED ORDER — SODIUM CHLORIDE 0.9% FLUSH: 3.0000 mL | INTRAVENOUS | Status: DC | PRN

## 2018-01-29 MED ORDER — LORAZEPAM 1 MG PO TABS: 1.0000 mg | ORAL_TABLET | ORAL | Status: DC | PRN

## 2018-01-29 MED ORDER — SODIUM CHLORIDE 0.9% FLUSH
3.0000 mL | Freq: Two times a day (BID) | INTRAVENOUS | Status: DC
  Administered 2018-01-29 – 2018-01-31 (×4): 3 mL via INTRAVENOUS

## 2018-01-29 MED ORDER — GLYCOPYRROLATE 1 MG PO TABS: 1.0000 mg | ORAL_TABLET | ORAL | Status: DC | PRN

## 2018-01-29 MED ORDER — POLYVINYL ALCOHOL 1.4 % OP SOLN
1.0000 [drp] | Freq: Four times a day (QID) | OPHTHALMIC | Status: DC | PRN
  Administered 2018-01-31: 1 [drp] via OPHTHALMIC
  Filled 2018-01-29: qty 15

## 2018-01-29 MED ORDER — SODIUM CHLORIDE 0.9 % IV SOLN: 250.0000 mL | INTRAVENOUS | Status: DC | PRN

## 2018-01-29 NOTE — Progress Notes (Signed)
SLP Cancellation Note  Patient Details Name: Kylie Allen MRN: [id number] DOB: 1923/08/26   Cancelled treatment:    Attempted to see pt for ongoing swallowing therapy.  Pt unavailable at time of attempt for patient care.  Will reattempt as SLP schedule permits.  [place] 01/29/2018, 8:47 AM

## 2018-01-29 NOTE — Consult Note (Signed)
Consultation Note Date: 01/29/2018   Patient Name: Kylie Allen  DOB: 1924-01-18  MRN: [id number]  Age / Sex: 84 y.o., female  PCP: Shawnee Knapp, MD Referring Physician: Florencia Reasons, MD  Reason for Consultation: Establishing goals of care and Psychosocial/spiritual support  HPI/Patient Profile: 84 y.o. female  with past medical history of breast cancer, CKD, anxiety and depression, bradycardia, and previous stroke, who was admitted on 01/27/2018 with aphasia and right side neglect.  She was found to have a left frontal lobe infarct.  She has unfortunately failed her swallow evaluation.   An unusual wound with necrosis was found on her right side.  The attending physician is concerned this wound may be a recurrence of breast cancer.  Clinical Assessment and Goals of Care:  I have reviewed medical records including EPIC notes, labs and imaging, received report from the bedside RNs, assessed the patient and then met at the bedside along with her daughters [name] and [name] to discuss diagnosis prognosis, GOC, EOL wishes, disposition and options.  I introduced Palliative Medicine as specialized medical care for people living with serious illness. It focuses on providing relief from the symptoms and stress of a serious illness. The goal is to improve quality of life for both the patient and the family.  We discussed a brief life review of the patient.  She is the last of 10 siblings.  She briefly attended [employer].  She had 2 beautiful daughters.  She had a mastectomy for breast cancer at age 72 and suffered with depression there after.  She had multiple hospitalizations for depression.  She is Jehovah's Witness and derives strength from her religious beliefs.  She was an excellent seamstress.    She does not like to be touched.  Prior to moving in with [name] she had not bathed in two years.  She stopped all  of her medications.  3-4 months ago she moved in with [name] - but still would not let [name] assist her with bathing or dressing.  [name] now believes this is due to the wound on her right side.  She did not let her daughters know about the wound.  As far as functional and nutritional status she was walking and talking prior to the stroke, but in the last several weeks she slept most of the time and ate very little.  She said several times she was ready to die.  We discussed her current illness and what it means in the larger context of her on-going co-morbidities.  Natural disease trajectory and expectations at EOL were discussed. The difference between aggressive medical intervention and comfort care was considered in light of the patient's goals of care.  Advanced directives, concepts specific to code status, artifical feeding and hydration, and rehospitalization were considered and discussed.  Hospice and Palliative Care services outpatient were explained and offered.  [name] and [name] have elected to go to [facility] for 24 hour care at end of life.  They want their mother to have as little suffering  and as much comfort as possible.  Questions and concerns were addressed. The family was encouraged to call with questions or concerns.        Primary Decision Maker:  NEXT OF KIN 2 daughters    SUMMARY OF RECOMMENDATIONS    Shift to full comfort.  D/C [facility] place when bed is available (Both daughters are familiar with BP) Discontinue interventions not related to comfort. Initiate comfort medications including scheduled ativan and robinul  Code Status/Advance Care Planning:  DNR   Symptom Management:   Robinul for copious secretions.  Additional Recommendations (Limitations, Scope, Preferences):  Wound care on right side.  Palliative Prophylaxis:   Aspiration  Psycho-social/Spiritual:   Desire for further Chaplaincy support:  yes  Prognosis:  Less  than 2 weeks.  Patient is aspirating on her own secretions.  Family wants only comfort for her.    Discharge Planning: Hospice facility      Primary Diagnoses: Present on Admission: . CVA (cerebral vascular accident) ([place])   I have reviewed the medical record, interviewed the patient and family, and examined the patient. The following aspects are pertinent.  Past Medical History:  Diagnosis Date  . Bradycardia   . Breast cancer (HCC)    hx of  . CKD (chronic kidney disease)   . Depression with anxiety   . Diverticulosis    hx of  . Fatigue   . GERD (gastroesophageal reflux disease)   . H/O: hysterectomy   . History of echocardiogram    Echo (8/15): EF 55-60%, normal wall motion, grade 1 diastolic dysfunction, mild AI, moderate TR, PASP 54 mm Hg  . Hyperlipidemia   . Hypertension    Social History   Socioeconomic History  . Marital status: Widowed    Spouse name: Not on file  . Number of children: Not on file  . Years of education: Not on file  . Highest education level: Not on file  Occupational History  . Not on file  Social Needs  . Financial resource strain: Not on file  . Food insecurity:    Worry: Not on file    Inability: Not on file  . Transportation needs:    Medical: Not on file    Non-medical: Not on file  Tobacco Use  . Smoking status: Former Research scientist (life sciences)  . Smokeless tobacco: Never Used  Substance and Sexual Activity  . Alcohol use: No    Alcohol/week: 0.0 standard drinks  . Drug use: Not on file  . Sexual activity: Not on file  Lifestyle  . Physical activity:    Days per week: Not on file    Minutes per session: Not on file  . Stress: Not on file  Relationships  . Social connections:    Talks on phone: Not on file    Gets together: Not on file    Attends religious service: Not on file    Active member of club or organization: Not on file    Attends meetings of clubs or organizations: Not on file    Relationship status: Not on file  Other  Topics Concern  . Not on file  Social History Narrative  . Not on file   Family History  Problem Relation Age of Onset  . Coronary artery disease Mother        family hx of  . Hypertension Mother        hx of  . Stroke Sister   . Heart attack Neg Hx    Scheduled  Meds: . aspirin  300 mg Rectal Daily   Or  . aspirin  325 mg Oral Daily  . atorvastatin  40 mg Oral q1800  . enoxaparin (LOVENOX) injection  40 mg Subcutaneous Q24H   Continuous Infusions: . sodium chloride 75 mL/hr at 01/29/18 0217  . famotidine (PEPCID) IV     PRN Meds:.acetaminophen **OR** acetaminophen (TYLENOL) oral liquid 160 mg/5 mL **OR** acetaminophen, hydrALAZINE, LORazepam, ondansetron No Known Allergies Review of Systems patient is aphasic  Physical Exam  Well developed elderly, frail female, non-verbal.  Nods or shakes her head appropriate to some questions. Patient struggling slightly to manage the saliva in her throat.  Will not allow me to suction her. CV tachycardia Resp no distress Abdomen patient indicates that she does not want me to palpate her abdomen. Thin, non-distended. LE trace edema.  Vital Signs: BP (!) 142/72 (BP Location: Right Arm)   Pulse 100   Temp 98.3 F (36.8 C) (Oral)   Resp (!) 28   Ht _0  (1.6 m)   Wt 53 kg   SpO2 97%   BMI 20.70 kg/m  Pain Scale: PAINAD POSS *See Group Information*: 1-Acceptable,Awake and alert Pain Score: 0-No pain   SpO2: SpO2: 97 % O2 Device:SpO2: 97 % O2 Flow Rate: .   IO: Intake/output summary:   Intake/Output Summary (Last 24 hours) at 01/29/2018 1001 Last data filed at 01/29/2018 0000 Gross per 24 hour  Intake 586.09 ml  Output -  Net 586.09 ml    LBM: Last BM Date: (pta) Baseline Weight: Weight: 53 kg Most recent weight: Weight: 53 kg     Palliative Assessment/Data: 20%     Time In: 2:00 Time Out: 3:30 Time Total: 90 min. Greater than 50%  of this time was spent counseling and coordinating care related to the above  assessment and plan.  Signed by: Florentina Jenny, PA-C Palliative Medicine Pager: [phone]  Please contact Palliative Medicine Team phone at [phone] for questions and concerns.  For individual provider: See She[facility]

## 2018-01-29 NOTE — Progress Notes (Addendum)
  Speech Language Pathology Treatment: Dysphagia  Patient Details Name: Kylie Allen MRN: [id number] DOB: 1924/03/26 Today's Date: 01/29/2018 Time: 6503-5465 SLP Time Calculation (min) (ACUTE ONLY): 7 min  Assessment / Plan / Recommendation Clinical Impression  Pt continues to present with clinical s/s of oral and pharyngeal dysphagia.  Pt was unable to masticate ice chip which was removed from oral cavity.  With small amounts of water via spoon, pt required multiple swallows per bolus and swallow initiation was seemingly delayed.  Pt exhibited poor oral clearance of puree bolus.  Majority of bolus was removed from oral cavity via suction. There was delayed throat clearing noted following completion of PO trials.   Recommend pt remain NPO at present.  Pt does not have alternate means of nutrition at this time.  SLP to await palliative consult/goals of care discussion prior to any instrumental testing.   HPI HPI: Kylie Allen is a 84 year old woman with medical problems including recurrent history of right-sided breast cancer treated with mastectomy and radiation, advanced dementia with requirement of ADL assistance and at baseline recognizes daughters - spends over 90% of time in bed, anxiety, postherpetic neuralgia, and hypertension who presents with difficulty speaking.  CT head 01/27/18 indicated Acute/subacute left frontal lobe cortical infarct without hemorrhage.  MRI orders outstanding.      SLP Plan  Continue with current plan of care       Recommendations  Diet recommendations: NPO               Oral Care Recommendations: Oral care BID SLP Visit Diagnosis: Dysphagia, oropharyngeal phase (R13.12) Plan: Continue with current plan of care       GO                Arnoldo Hildreth E Kella Splinter, MA, CCC-SLP.  Pager [phone] 01/29/2018, 2:17 PM

## 2018-01-29 NOTE — Progress Notes (Signed)
No charge note  PMT meeting scheduled at 3:00 pm today with daughters.  Florentina Jenny, PA-C Palliative Medicine Pager: [phone]

## 2018-01-29 NOTE — Progress Notes (Signed)
STROKE TEAM PROGRESS NOTE   SUBJECTIVE (INTERVAL HISTORY) [name]  Pt still has global aphasia, with right neglect and hemianopia. Palliative care on board, discussed with daughters and they requested hospice given poor prognosis.   OBJECTIVE Vitals:   01/29/18 0439 01/29/18 0744 01/29/18 1237 01/29/18 1621  BP: (!) 203/95 (!) 142/72 124/72 (!) 147/63  Pulse:   88 (!) 107  Resp:  (!) 28 16 16   Temp: 97.9 F (36.6 C) 98.3 F (36.8 C) 98.2 F (36.8 C) 99.4 F (37.4 C)  TempSrc: Axillary Oral Oral Oral  SpO2:  97% 98% 99%  Weight:      Height:        CBC:  Recent Labs  Lab 01/27/18 1957 01/28/18 0339  WBC 9.3 9.0  NEUTROABS 6.8  --   HGB 12.0 11.2*  HCT 38.1 34.3*  MCV 99.0 95.5  PLT 283 917    Basic Metabolic Panel:  Recent Labs  Lab 01/28/18 0339 01/29/18 0508  NA 143 140  K 2.8* 3.3*  CL 103 103  CO2 27 21*  GLUCOSE 112* 117*  BUN 10 7*  CREATININE 0.98 0.96  CALCIUM 8.8* 8.6*  MG  --  1.7    Lipid Panel:     Component Value Date/Time   CHOL 222 (H) 01/28/2018 0339   TRIG 94 01/28/2018 0339   HDL 44 01/28/2018 0339   CHOLHDL 5.0 01/28/2018 0339   VLDL 19 01/28/2018 0339   LDLCALC 159 (H) 01/28/2018 0339   HgbA1c:  Lab Results  Component Value Date   HGBA1C 5.5 01/28/2018   Urine Drug Screen: No results found for: LABOPIA, COCAINSCRNUR, LABBENZ, AMPHETMU, THCU, LABBARB  Alcohol Level     Component Value Date/Time   ETH <10 01/27/2018 1614    IMAGING I have personally reviewed the radiological images below and agree with the radiology interpretations.  Ct Angio Head W Or Wo Contrast Ct Angio Neck W Or Wo Contrast 01/27/2018 IMPRESSION:  1. Acute/subacute left frontal lobe cortical infarct without hemorrhage.  2. Focal asymmetric decrease in vascularity than left MCA branches associated with the acute infarct. No associated large vessel occlusion is present.  3. Focal vessel irregularity and beading in the left cervical  internal carotid artery raises concern for fibromuscular dysplasia.  4. Tortuosity of the distal internal carotid arteries bilaterally. This is nonspecific, but most commonly seen in the setting of chronic hypertension.  5. Mild diffuse distal small vessel disease is present without a significant proximal large vessel occlusion.  6. Atrophy and white matter changes are otherwise within normal limits for age.  7.  Aortic Atherosclerosis (ICD10-I70.0).  8. 4 mm right upper lobe lung nodule. No follow-up needed if patient is low-risk. Non-contrast chest CT can be considered in 12 months if patient is high-risk.   Transthoracic Echocardiogram - Left ventricle: LVEF Is 50 to 55% with basal inferior   hypokinesis. The cavity size was normal. Wall thickness was   normal. - Aortic valve: There was trivial regurgitation. - Pulmonary arteries: PA peak pressure: 35 mm Hg (S).   PHYSICAL EXAM  Temp:  [97.9 F (36.6 C)-99.4 F (37.4 C)] 99.4 F (37.4 C) (08/13 1621) Pulse Rate:  [77-107] 107 (08/13 1621) Resp:  [16-28] 16 (08/13 1621) BP: (124-203)/(63-95) 147/63 (08/13 1621) SpO2:  [97 %-99 %] 99 % (08/13 1621)  General - cachetic, well developed, mildly agitated and restless.  Ophthalmologic - fundi not visualized due to noncooperation.  Cardiovascular - Regular  rate and rhythm.  Neuro - awake, alert, global aphasia, non verbal, but able to follow central commands only, but not peripheral commands. Right side neglect, not blinking to visual threat on the right. PERRL, left gaze preference but able to cross midline. Able to track in the left visual field. Right facial droop, tongue midline. LUE and LLE 4/5, RLE 4/5 but RUE 4-/5 could be partly due to right large deep wound at right armpit. DTR 1+ and no babinski. Sensation, coordination and gait not tested   ASSESSMENT/PLAN [name] is a 84 y.o. female with history of breast CA, CKD,HTN, HLD, depression, anxiety, and advanced  dementia  presenting with aphasia. She did not receive IV t-PA due to late presentation.  Stroke: acute/subacute left frontal lobe cortical infarct - embolic - unknown etiology  Resultant  Aphasia, right facial droop, right arm weakness, right neglect  CT head - Acute/subacute left frontal lobe cortical infarct   MRI head - not performed due to comfort care  CTA H&N - unremarkable  2D Echo - EF 50-55%  LDL - 159  HgbA1c - 5.5  VTE prophylaxis - Lovenox  Diet - NPO - NS @ 75 cc/hr  aspirin 81 mg daily prior to admission, now on aspirin 300mg  PR  Given pt advance age, global aphasia, dysphagia, advanced dementia, palliative care involved.  Daughter requested hospice.  Hypertension  Stable on the high end  Hyperlipidemia  Lipid lowering medication PTA:  none  LDL 159, goal < 70  Dysphagia  Did not pass swallow  NPO  Not recommend PEG - poor prognosis  palliative care involved - comfort care measures  Other Stroke Risk Factors  Advanced age  Former cigarette smoker - quit  Family hx stroke (sister)  Other Active Problems  Hypokalemia 2.9->2.8  4 mm right upper lobe lung nodule.   Dementia   Hospital day # 2  Neurology will sign off. Please call with questions. Thanks for the consult.   Rosalin Hawking, MD PhD Stroke Neurology 01/29/2018 4:54 PM    To contact Stroke Continuity provider, please refer to [email or web address] After hours, contact General Neurology

## 2018-01-29 NOTE — Progress Notes (Signed)
[facility])  Received request from [place] for family interest in [facility]. Chart reviewed and spoke briefly with daughter [name] by phone. Left message for daughter [name]. [name] aware we will follow up tomorrow morning with update regarding availability. Will update Kathlee Nations in am. Appreciate input from PMT PA [place].  Thank you,  Erling Conte, LCSW [phone]

## 2018-01-29 NOTE — Progress Notes (Signed)
PROGRESS NOTE  Kylie Allen LKH:[id number] DOB: [birth year] DOA: 01/27/2018 PCP: Shawnee Knapp, MD  HPI/Recap of past 24 hours:   She appear comfortable, she remain aphasia, she appear exhibited poor oral clearance with swallow eval And appear  is aspirating  On her own secretions   Multiple family at bedside  Assessment/Plan: Active Problems:   CVA (cerebral vascular accident) ([place])  Acute CVA -she has right sided neglect, aphasia, she did not pass swallow eval -neurology consulted input appreciated -family has met with palliative care today and decided on full comfort measures and residential hospice placement   Hypokalemia: Replaced, now on full comfort measures   Right axilla wound extend to her back,  Unclear duration, she does not have fever, no leukocytosis, she does not appear in significant pain [name] reports patient has remote h/o breast cancer on the right side, she does not know the details  I told [name] my concerns that this might be cancer, but even if this is cancer , patient is too frail, she will not be a candidate for aggressive cancer treatment. Daughter [name] agree to not doing further imaging or biopsy, will focus on local wound care, pain control. family has met with palliative care today and decided on full comfort measures and residential hospice placement    FTT: over all poor prognosis family has met with palliative care today and decided on full comfort measures and residential hospice placement D/c tele   Code Status: DNR ,confirmed with [name] at bedside  Family Communication: patient and daughter  Disposition Plan: on full comfort measures, residential hospice when bed is available   Consultants:  Neurology   Palliative care  Wound care  Procedures:  none  Antibiotics:  none   Objective: BP (!) 142/72 (BP Location: Right Arm)   Pulse 100   Temp 98.3 F (36.8 C) (Oral)   Resp (!) 28   Ht _0  (1.6 m)    Wt 53 kg   SpO2 97%   BMI 20.70 kg/m   Intake/Output Summary (Last 24 hours) at 01/29/2018 0964 Last data filed at 01/29/2018 0000 Gross per 24 hour  Intake 586.09 ml  Output -  Net 586.09 ml   Filed Weights   01/27/18 2055  Weight: 53 kg    Exam: Patient is examined daily including today on 01/29/2018, exams remain the same as of yesterday except that has changed    General:  NAD, alert, following commands on the left side, right side neglect, aphasia  Cardiovascular: RRR  Respiratory: CTABL  Abdomen: Soft/ND/NT, positive BS  Musculoskeletal: right axilla wound extends to her back, no odor, no active drainage, no blood ( see pics below)  Neuro: alert, following commands on the left side, right side neglect, aphasia          Data Reviewed: Basic Metabolic Panel: Recent Labs  Lab 01/27/18 1624 01/27/18 1957 01/28/18 0339 01/29/18 0508  NA 140 143 143 140  K 4.8 2.9* 2.8* 3.3*  CL 102 102 103 103  CO2  --  23 27 21*  GLUCOSE 113* 107* 112* 117*  BUN _1 7*  CREATININE 0.80 0.97 0.98 0.96  CALCIUM  --  9.0 8.8* 8.6*  MG  --   --   --  1.7   Liver Function Tests: Recent Labs  Lab 01/27/18 1957  AST 18  ALT 8  ALKPHOS 46  BILITOT 1.1  PROT 6.1*  ALBUMIN 2.8*   No results for input(s): LIPASE,  AMYLASE in the last 168 hours. No results for input(s): AMMONIA in the last 168 hours. CBC: Recent Labs  Lab 01/27/18 1624 01/27/18 1957 01/28/18 0339  WBC  --  9.3 9.0  NEUTROABS  --  6.8  --   HGB 12.2 12.0 11.2*  HCT 36.0 38.1 34.3*  MCV  --  99.0 95.5  PLT  --  283 309   Cardiac Enzymes:   No results for input(s): CKTOTAL, CKMB, CKMBINDEX, TROPONINI in the last 168 hours. BNP (last 3 results) No results for input(s): BNP in the last 8760 hours.  ProBNP (last 3 results) No results for input(s): PROBNP in the last 8760 hours.  CBG: Recent Labs  Lab 01/28/18 1633 01/29/18 0431  GLUCAP 86 100*    No results found for this or any  previous visit (from the past 240 hour(s)).   Studies: No results found.  Scheduled Meds: . aspirin  300 mg Rectal Daily   Or  . aspirin  325 mg Oral Daily  . atorvastatin  40 mg Oral q1800  . enoxaparin (LOVENOX) injection  40 mg Subcutaneous Q24H    Continuous Infusions: . sodium chloride 75 mL/hr at 01/29/18 0217     Time spent: 25 mins, case discussed with neurology  I have personally reviewed and interpreted on  01/29/2018 daily labs, tele strips, imagings as discussed above under date review session and assessment and plans.  I reviewed all nursing notes, pharmacy notes, consultant notes,  vitals, pertinent old records  I have discussed plan of care as described above with RN , patient and family on 01/29/2018   Florencia Reasons MD, PhD  [facility] Pager [phone]. If 7PM-7AM, please contact night-coverage at [email or web address], password [facility] 01/29/2018, 8:52 AM  LOS: 2 days

## 2018-01-29 NOTE — Progress Notes (Signed)
Chaplain Note:  Received a consult indicating that pt. Is Jehovah's witness. I met with family and patient. A pleasant family who assured me that their faith community have surrounded them with many visits. They were appreciative of the contact and offer of support.   Sue Lush

## 2018-01-30 DIAGNOSIS — I639 Cerebral infarction, unspecified: Secondary | ICD-10-CM

## 2018-01-30 DIAGNOSIS — E785 Hyperlipidemia, unspecified: Secondary | ICD-10-CM

## 2018-01-30 DIAGNOSIS — Z515 Encounter for palliative care: Secondary | ICD-10-CM

## 2018-01-30 MED ORDER — WHITE PETROLATUM EX OINT
TOPICAL_OINTMENT | CUTANEOUS | Status: AC
  Filled 2018-01-30: qty 28.35

## 2018-01-30 MED ORDER — WHITE PETROLATUM EX OINT
TOPICAL_OINTMENT | CUTANEOUS | Status: AC
  Administered 2018-01-30: 0.2
  Filled 2018-01-30: qty 28.35

## 2018-01-30 MED ORDER — GLYCOPYRROLATE 0.2 MG/ML IJ SOLN
0.2000 mg | Freq: Two times a day (BID) | INTRAMUSCULAR | Status: DC
  Administered 2018-01-30 – 2018-01-31 (×2): 0.2 mg via INTRAVENOUS
  Filled 2018-01-30 (×2): qty 1

## 2018-01-30 NOTE — Progress Notes (Signed)
PROGRESS NOTE    Kylie Allen  WVP:[id number] DOB: May 03, 1924 DOA: 01/27/2018 PCP: Shawnee Knapp, MD    Brief Narrative:  84 year old woman with medical problems including recurrent history of right-sided breast cancer treated with mastectomy and radiation, advanced dementia with requirement of ADL assistance and at baseline recognizes daughters - spends over 90% of time in bed, anxiety, postherpetic neuralgia, and hypertension who presents with difficulty speaking. History is obtained from the patient's daughters who are present with her at the bedside include these include Drue Dun and Lynnell Grain.  They report that the patient moved in with [name] about 4 months ago, currently lives with [name]'s family. They previously had home care nurse visiting to help take care of the patient with regard to ADLs, but the patient dismissed them/refused. 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 Family reports she's had progressive weight loss within the past 2-3 months, previously weighing 124 pounds now down to 93 pounds as measured on the Friday before admission.  Per the patient family report, the patient was normal at 11 AM, she was speaking at that time. They attempted to bring her lunch at about 1 PM on the day of admission, found her to have right-sided facial droop as well as inability to talk. Attempts to get her to eat were unsuccessful, EMS called resulting in transport to the [facility].  ED Course: vital signs reviewed remarkable for tachycardia to 100-110, respiratory rate 29, systolic blood pressure ranging from 180-190 mmHg.  Diagnostics obtained include CT angiogram of the head and neck which revealed acute/subacute left frontal lobe cortical infarct without hemorrhage, some focal vessel irregularity with normal left cervical internal carotid artery raising suspicion for fibromuscular dysplasia, and right upper lobe lung nodule 4 mm. Chest  x-ray revealed no acute abnormality.  I stat labs remarkable for hemoglobin of 12.2, creatinine 0.8, normal sodium and potassium.  Neurology team consulted recommended further diagnostics including MRI brain, transthoracic echocardiogram, by mouth aspirin and statin after passing swallow eval, as well as DVT prophylaxis, PT OT [facility]  Hospital medicine consulted for further management.  Assessment & Plan:   Active Problems:   CVA (cerebral vascular accident) ([place])   Aspiration into airway   Cerebral infarction [facility])   Palliative care encounter   Comfort measures only status  Acute CVA -Patient noted to have right sided neglect, aphasia, she did not pass swallow eval -neurology had been following -family has met with palliative care today and decided on full comfort measures and residential hospice placement -Neurology has since signed off  Hypokalemia: Replaced, now on full comfort measures  Right axilla wound extend to her back,  Unclear duration, she does not have fever, no leukocytosis, she does not appear in significant pain [name] reports patient has remote h/o breast cancer on the right side, she does not know the details  Family aware of possible cancer diagnosis and that patient would likely not be an ideal candidate for aggressive cancer treatment. No further imaging or biopsy per family wishes with focus on local wound care, pain control. Full comfort care as per above  FTT: over all poor prognosis family has met with palliative care and decided on full comfort measures and residential hospice placement  DVT prophylaxis: Comfort Code Status: DNR/comfort Family Communication: Pt in room, family not at bedside Disposition Plan: Residential hospice pending  Consultants:   Neurology   Palliative care  Wound care  Procedures:     Antimicrobials:  Anti-infectives (From admission, onward)   None       Subjective: Unable to vocalilze  Objective: Vitals:    01/29/18 1621 01/29/18 1922 01/30/18 1227 01/30/18 1708  BP: (!) 147/63 (!) 146/57 (!) 167/76 (!) 173/82  Pulse: (!) 107 (!) 115  91  Resp: _0 Temp: 99.4 F (37.4 C) 99.1 F (37.3 C) 99 F (37.2 C) 98.6 F (37 C)  TempSrc: Oral Oral Oral Oral  SpO2: 99% 97% 97% 97%  Weight:      Height:        Intake/Output Summary (Last 24 hours) at 01/30/2018 1738 Last data filed at 01/30/2018 1206 Gross per 24 hour  Intake 0 ml  Output -  Net 0 ml   Filed Weights   01/27/18 2055  Weight: 53 kg    Examination:  General exam: Appears calm and comfortable  Respiratory system: no audible wheezing. Respiratory effort normal.  Data Reviewed: I have personally reviewed following labs and imaging studies  CBC: Recent Labs  Lab 01/27/18 1624 01/27/18 1957 01/28/18 0339  WBC  --  9.3 9.0  NEUTROABS  --  6.8  --   HGB 12.2 12.0 11.2*  HCT 36.0 38.1 34.3*  MCV  --  99.0 95.5  PLT  --  283 829   Basic Metabolic Panel: Recent Labs  Lab 01/27/18 1624 01/27/18 1957 01/28/18 0339 01/29/18 0508  NA 140 143 143 140  K 4.8 2.9* 2.8* 3.3*  CL 102 102 103 103  CO2  --  23 27 21*  GLUCOSE 113* 107* 112* 117*  BUN _1 7*  CREATININE 0.80 0.97 0.98 0.96  CALCIUM  --  9.0 8.8* 8.6*  MG  --   --   --  1.7   GFR: Estimated Creatinine Clearance: 31.6 mL/min (by C-G formula based on SCr of 0.96 mg/dL). Liver Function Tests: Recent Labs  Lab 01/27/18 1957  AST 18  ALT 8  ALKPHOS 46  BILITOT 1.1  PROT 6.1*  ALBUMIN 2.8*   No results for input(s): LIPASE, AMYLASE in the last 168 hours. No results for input(s): AMMONIA in the last 168 hours. Coagulation Profile: No results for input(s): INR, PROTIME in the last 168 hours. Cardiac Enzymes: No results for input(s): CKTOTAL, CKMB, CKMBINDEX, TROPONINI in the last 168 hours. BNP (last 3 results) No results for input(s): PROBNP in the last 8760 hours. HbA1C: Recent Labs    01/28/18 0339  HGBA1C 5.5   CBG: Recent  Labs  Lab 01/28/18 1633 01/29/18 0431  GLUCAP 86 100*   Lipid Profile: Recent Labs    01/28/18 0339  CHOL 222*  HDL 44  LDLCALC 159*  TRIG 94  CHOLHDL 5.0   Thyroid Function Tests: No results for input(s): TSH, T4TOTAL, FREET4, T3FREE, THYROIDAB in the last 72 hours. Anemia Panel: No results for input(s): VITAMINB12, FOLATE, FERRITIN, TIBC, IRON, RETICCTPCT in the last 72 hours. Sepsis Labs: No results for input(s): PROCALCITON, LATICACIDVEN in the last 168 hours.  No results found for this or any previous visit (from the past 240 hour(s)).   Radiology Studies: No results found.  Scheduled Meds: . glycopyrrolate  0.2 mg Intravenous BID  . LORazepam  0.5 mg Oral BID  . sodium chloride flush  3 mL Intravenous Q12H  . white petrolatum      . white petrolatum       Continuous Infusions: . sodium chloride    . famotidine (PEPCID) IV 20 mg (01/30/18  1206)     LOS: 3 days   Marylu Lund, MD [facility] Pager [phone]  If 7PM-7AM, please contact night-coverage [email or web address] Password [facility] 01/30/2018, 5:38 PM

## 2018-01-30 NOTE — Progress Notes (Signed)
[facility])  Continue to follow for family interest in [facility]. Unfortunately [facility] does not have room to offer this morning. CSW Kathlee Nations has been updated. Spoke with daughter [name] by phone to make her aware. She is aware [facility] hospital team will continue to follow and update until disposition is determined.   Thank you, Erling Conte, LCSW [phone]

## 2018-01-30 NOTE — Progress Notes (Signed)
Visited patient.  She appears frustrated by not being able to speak.   Asked her multiple yes/no questions about her comfort which she appeared to answer appropriate by nodding or shaking her head.    Performed mouth care.  Her mouth appears very dry.  She still has some difficulty with fluid in her throat but it is improve over yesterday.  Plan:  Will decrease robinul slightly and offer comfort feeds in very small amounts.  No charge note.  Florentina Jenny, PA-C Palliative Medicine Pager: [phone]

## 2018-01-31 ENCOUNTER — Ambulatory Visit: Payer: Medicare Other | Admitting: Family Medicine

## 2018-01-31 ENCOUNTER — Encounter

## 2018-01-31 MED ORDER — LORAZEPAM 0.5 MG PO TABS: 0.5000 mg | ORAL_TABLET | Freq: Two times a day (BID) | ORAL | 0 refills | Status: AC

## 2018-01-31 MED ORDER — MORPHINE SULFATE (CONCENTRATE) 10 MG/0.5ML PO SOLN: 5.0000 mg | ORAL | 0 refills | Status: AC | PRN

## 2018-01-31 MED ORDER — BIOTENE DRY MOUTH MT LIQD: 15.0000 mL | OROMUCOSAL | Status: AC | PRN

## 2018-01-31 MED ORDER — POLYVINYL ALCOHOL 1.4 % OP SOLN: 1.0000 [drp] | Freq: Four times a day (QID) | OPHTHALMIC | 0 refills | Status: AC | PRN

## 2018-01-31 NOTE — Progress Notes (Signed)
Pt discharge education and instructions completed. Pt discharge to [facility] and report called off to nurse Heather at the facility. Pt pre-medicated prior to discharge upon daughter's request. Pt transported off unit via stretcher with belongings and daughter to the side. Pt discharge to the facility with IV intact and saline locked. Delia Heady RN

## 2018-01-31 NOTE — Progress Notes (Signed)
Daily Progress Note   Patient Name: Kylie Allen       Date: 01/31/2018 DOB: Jul 27, 1923  Age: 84 y.o. MRN#: [id number] Attending Physician: Donne Hazel, MD Primary Care Physician: Shawnee Knapp, MD Admit Date: 01/27/2018  Reason for Consultation/Follow-up: Psychosocial/spiritual support  Subjective: Sat with patient at bedside.  She is very alert.  She takes small nibbles of applesauce but they remain on her tongue.  I asked her questions and she seemed to answer appropriately with head nods.  She attempted to speak several times and was frustrated at not being able to do so.   She smiled multiple times during our conversation and seemed pleasant.  I spoke with [name] by phone to update her.  Per [name] her mother is able to stand and walk to the bathroom.  She is not incontinent of urine.  She commented on her mother's alertness and how she was hopeful for recovery.  We reviewed that not being able to take in nutrition means that she will not survive.  [name] agreed.  They do not want a feeding tube.   [name] feels that if her mother can not converse (she loved to converse) she would not want her life prolonged.  We also discussed the wound on her mother's right side and the question of whether or not it is related to her previous BRCA.   I mentioned to her that RNs at [facility] will perform wound care.  [name] is in agreement with the plan for [facility], but she is still hopeful that her mother will regain the ability to swallow and live a few more years.   Assessment: 84 yo female alert and pleasant -  left aphasic and unable to swallow after a CVA   Patient Profile/HPI:  84 y.o. female  with past medical history of breast cancer, CKD, anxiety and depression, bradycardia, and  previous stroke, who was admitted on 01/27/2018 with aphasia and right side neglect.  She was found to have a left frontal lobe infarct.  She has unfortunately failed her swallow evaluation.   An unusual wound with necrosis was found on her right side.  The attending physician is concerned this wound may be a recurrence of breast cancer.  Length of Stay: 4  Current Medications: Scheduled Meds:  . glycopyrrolate  0.2  mg Intravenous BID  . LORazepam  0.5 mg Oral BID  . sodium chloride flush  3 mL Intravenous Q12H    Continuous Infusions: . sodium chloride    . famotidine (PEPCID) IV 20 mg (01/31/18 1011)    PRN Meds: sodium chloride, [DISCONTINUED] acetaminophen **OR** acetaminophen (TYLENOL) oral liquid 160 mg/5 mL **OR** acetaminophen, antiseptic oral rinse, [DISCONTINUED] haloperidol **OR** [DISCONTINUED] haloperidol **OR** haloperidol lactate, LORazepam, morphine injection, morphine CONCENTRATE **OR** morphine CONCENTRATE, ondansetron, ondansetron **OR** ondansetron (ZOFRAN) IV, polyvinyl alcohol, sodium chloride flush  Physical Exam        Thin well developed female with an odor likely from wound on right side.   Awake, alert, pleasant.  Unable to speak. CV rrr resp no distress Abdomen (Patient deferred palpation)   Vital Signs: BP (!) 163/98 (BP Location: Left Arm)   Pulse 99   Temp 98.8 F (37.1 C) (Oral)   Resp 16   Ht '5\' 3"'  (1.6 m)   Wt 53 kg   SpO2 97%   BMI 20.70 kg/m  SpO2: SpO2: 97 % O2 Device: O2 Device: Room Air O2 Flow Rate:    Intake/output summary:   Intake/Output Summary (Last 24 hours) at 01/31/2018 1045 Last data filed at 01/31/2018 0400 Gross per 24 hour  Intake 175 ml  Output -  Net 175 ml   LBM: Last BM Date: 01/30/18 Baseline Weight: Weight: 53 kg Most recent weight: Weight: 53 kg       Palliative Assessment/Data: able to stand but unable and uninterested in swallowing.      Patient Active Problem List   Diagnosis Date Noted  .  Aspiration into airway   . Cerebral infarction ([place])   . Palliative care encounter   . Comfort measures only status   . CVA (cerebral vascular accident) ([place]) 01/27/2018  . Anxiety state 01/22/2017  . PVC's (premature ventricular contractions) 12/30/2015  . SVT (supraventricular tachycardia) ([place]) 12/30/2015  . Medication noncompliance due to cognitive impairment 11/16/2015  . Dementia 08/26/2014  . Bradycardia 04/19/2011  . Hyperlipidemia 05/10/2009  . DEPRESSION 05/10/2009  . MIGRAINE HEADACHE 05/10/2009  . Essential hypertension 05/10/2009  . GERD 05/10/2009  . Chronic kidney disease 05/10/2009  . BREAST CANCER, HX OF 05/10/2009  . SYNCOPE, HX OF 05/10/2009  . DIVERTICULITIS, HX OF 05/10/2009  . Personal history of urinary disorder 05/10/2009    Palliative Care Plan    Recommendations/Plan:  Secretion management/ reflux management is much better today.  Patient is not as symptomatic.  D/C to [facility] place.  Continue scheduled Ativan (she is dependent)  Wound care to right side.  Goals of Care and Additional Recommendations:  Limitations on Scope of Treatment: Full Comfort Care  Code Status:  DNR  Prognosis:   < 2 weeks - unable to accept nutrition, unable to swallow or speak.  Discharge Planning:  Hospice facility  Care plan was discussed with [name], dtr.  Thank you for allowing the Palliative Medicine Team to assist in the care of this patient.  Total time spent:  35 min.     Greater than 50%  of this time was spent counseling and coordinating care related to the above assessment and plan.  Florentina Jenny, PA-C Palliative Medicine  Please contact Palliative MedicineTeam phone at [phone] for questions and concerns between 7 am - 7 pm.   Please see AMION for individual provider pager numbers.

## 2018-01-31 NOTE — Clinical Social Work Note (Signed)
Clinical Social Work Assessment  Patient Details  Name: Kylie Allen MRN: [id number] Date of Birth: November 25, 1923  Date of referral:  01/31/18               Reason for consult:  End of Life/Hospice                Permission sought to share information with:  Facility Sport and exercise psychologist, Family Supports Permission granted to share information::  Yes, Verbal Permission Granted  Name::     Francoise Ceo  Agency::  [employer] [facility]  Relationship::     Contact Information:     Housing/Transportation Living arrangements for the past 2 months:  Single Family Home Source of Information:  Adult Children, Medical Team Patient Interpreter Needed:  None Criminal Activity/Legal Involvement Pertinent to Current Situation/Hospitalization:  No - Comment as needed Significant Relationships:  Adult Children Lives with:  Self, Adult Children Do you feel safe going back to the place where you live?  Yes Need for family participation in patient care:  Yes (Comment)  Care giving concerns:  Patient is at end of life and family is seeking residential hospice placement.   Social Worker assessment / plan:  CSW confirmed with team and family that plan is for transition to residential hospice with preference for [employer] [facility]. CSW sent referral and will follow.  Employment status:  Retired Nurse, adult PT Recommendations:  [place] / Referral to community resources:  [place]  Patient/Family's Response to care:  Patient's family requesting transfer to residential hospice.  Patient/Family's Understanding of and Emotional Response to Diagnosis, Current Treatment, and Prognosis:  Patient's daughters are aware of the patient's condition, but remain hopeful that maybe she will bounce back. Patient's daughters know that the patient would just want to be comfortable.  Emotional Assessment Appearance:  Appears stated  age Attitude/Demeanor/Rapport:  Unable to Assess Affect (typically observed):  Unable to Assess Orientation:    Alcohol / Substance use:  Not Applicable Psych involvement (Current and /or in the community):  No (Comment)  Discharge Needs  Concerns to be addressed:  Care Coordination Readmission within the last 30 days:  No Current discharge risk:  Terminally ill, Dependent with Mobility, Physical Impairment Barriers to Discharge:  No Barriers Identified   Geralynn Ochs, LCSW 01/31/2018, 12:07 PM

## 2018-01-31 NOTE — Progress Notes (Signed)
[facility] of [facility] RN note.  Received request from Kathlee Nations, [place] for family interest in [place] [facility] with request for transfer today.  Chart reviewed. Met with daughter, [name] to complete paperwork and answer any questions.  Family agreeable to transfer today. CSW aware.  Registration paper work completed.. Dr. [name] to assume care per family request. Please fax discharge summary to [phone]. RN please call report to [phone]. Please arrange transport for patient to arrive as soon as possible.  Thank you,    Farrel Gordon, RN, [facility] Liaison  [place] are on AMION

## 2018-01-31 NOTE — Discharge Summary (Signed)
Physician Discharge Summary  Kylie Allen FXT:[id number] DOB: 15-Mar-1924 DOA: 01/27/2018  PCP: Shawnee Knapp, MD  Admit date: 01/27/2018 Discharge date: 01/31/2018  Admitted From: Home Disposition:  Residential hospice  Recommendations for Outpatient Follow-up:  1. Follow up with hospice services  Discharge Condition:Terminal CODE STATUS:DNR, comfort Diet recommendation: NPO   Brief/Interim Summary: 84 year old woman with medical problems including recurrent history of right-sided breast cancer treated with mastectomy and radiation, advanced dementia with requirement of ADL assistance and at baseline recognizes daughters - spends over 90% of time in bed, anxiety, postherpetic neuralgia, and hypertension who presents with difficulty speaking. History is obtained from the patient's daughters who are present with her at the bedside include these include Drue Dun and Lynnell Grain.  They report that the patient moved in with [name] about 4 months ago, currently lives with [name]'s family. They previously had home care nurse visiting to help take care of the patient with regard to ADLs, but the patient dismissed them/refused. 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 Family reports she's had progressive weight loss within the past 2-3 months, previously weighing 124 pounds now down to 93 pounds as measured on the Friday before admission.  Per the patient family report, the patient was normal at 11 AM, she was speaking at that time. They attempted to bring her lunch at about 1 PM on the day of admission, found her to have right-sided facial droop as well as inability to talk. Attempts to get her to eat were unsuccessful, EMS called resulting in transport to the [facility].  ED Course:vital signs reviewed remarkable for tachycardia to 100-110, respiratory rate 29, systolic blood pressure ranging from 180-190 mmHg. Diagnostics obtained include CT  angiogram of the head and neck which revealed acute/subacute left frontal lobe cortical infarct without hemorrhage, some focal vessel irregularity with normal left cervical internal carotid artery raising suspicion for fibromuscular dysplasia, and right upper lobe lung nodule 4 mm. Chest x-ray revealed no acute abnormality. I stat labs remarkable for hemoglobin of 12.2, creatinine 0.8, normal sodium and potassium. Neurology team consulted recommended further diagnostics including MRI brain, transthoracic echocardiogram, by mouth aspirin and statin after passing swallow eval, as well as DVT prophylaxis, PT OT [facility] consulted for further management.  Acute CVA -Patient noted to have right sided neglect, aphasia, she did not pass swallow eval -neurology had been following -family has met with palliative care and decided on full comfort measures and residential hospice placement -Neurology has since signed off -Continue ativan PO with sublingual morphine prescribed for comfort purposes  Hypokalemia: Replaced, now on full comfort measures  Right axilla wound extend to her back,  Unclear duration, she does not have fever, no leukocytosis, she does not appear in significant pain [name] reports patient has remote h/o breast cancer on the right side,she does not know the details Family aware of possible cancer diagnosis and that patient would likely not be an ideal candidate for aggressive cancer treatment. No further imaging or biopsy per family wishes with focus on local wound care, pain control. Full comfort care as per above  FTT: over all poor prognosis family has met with palliative care and decided on full comfort measures and residential hospice placement  Discharge Diagnoses:  Active Problems:   CVA (cerebral vascular accident) [facility])   Aspiration into airway   Cerebral infarction [facility])   Palliative care encounter   Comfort measures only status    Discharge  Instructions  Allergies as of 01/31/2018   No Known Allergies     Medication List    STOP taking these medications   ADULT GUMMY Chew   amLODipine 5 MG tablet Commonly known as:  NORVASC   aspirin EC 81 MG tablet   FIBER ADULT GUMMIES PO   gabapentin 100 MG capsule Commonly known as:  NEURONTIN   omeprazole 20 MG capsule Commonly known as:  PRILOSEC   omeprazole 40 MG capsule Commonly known as:  PRILOSEC   ranitidine 150 MG tablet Commonly known as:  ZANTAC     TAKE these medications   antiseptic oral rinse Liqd Apply 15 mLs topically as needed for dry mouth.   LORazepam 0.5 MG tablet Commonly known as:  ATIVAN Take 1 tablet (0.5 mg total) by mouth 2 (two) times daily for 10 days. What changed:    how much to take  how to take this  when to take this  additional instructions   morphine CONCENTRATE 10 MG/0.5ML Soln concentrated solution Place 0.25 mLs (5 mg total) under the tongue every 2 (two) hours as needed for moderate pain (or dyspnea).   polyvinyl alcohol 1.4 % ophthalmic solution Commonly known as:  LIQUIFILM TEARS Place 1 drop into both eyes 4 (four) times daily as needed for dry eyes.      Follow-up Information    Follow up with hospice services as needed Follow up.          No Known Allergies  Consultations:  Neurology  Palliative Care  Procedures/Studies: Ct Angio Head W Or Wo Contrast  Result Date: 01/27/2018 CLINICAL DATA:  Acute onset of right facial droop and aphasia beginning 8 hours ago. EXAM: CT ANGIOGRAPHY HEAD AND NECK TECHNIQUE: Multidetector CT imaging of the head and neck was performed using the standard protocol during bolus administration of intravenous contrast. Multiplanar CT image reconstructions and MIPs were obtained to evaluate the vascular anatomy. Carotid stenosis measurements (when applicable) are obtained utilizing NASCET criteria, using the distal internal carotid diameter as the denominator. CONTRAST:  45m  ISOVUE-370 IOPAMIDOL (ISOVUE-370) INJECTION 76% COMPARISON:  None. FINDINGS: CT HEAD FINDINGS Brain: Acute/subacute nonhemorrhagic infarct is present in the posterior left frontal lobe with some involvement of the precentral gyrus. There is partial effacement of the sulci without midline shift or mass effect on the ventral. No other acute or subacute infarct is present. A remote lacunar infarct is present in the right caudate head. Mild atrophy and white matter changes are otherwise within normal limits for age. The ventricles are proportionate to the degree of atrophy. No significant extra-axial fluid collection is present. The brainstem is normal. Remote lacunar infarcts are present in the right cerebellum. Vascular: A right cages are present within the cavernous internal carotid arteries. There is no hyperdense vessel. Skull: Calvarium is intact. No focal lytic or blastic lesions are present. Asymmetric degenerative changes are noted at the left TMJ. Sinuses: The paranasal sinuses and mastoid air cells are clear. Orbits: Bilateral lens replacements are present. Globes and orbits are within normal limits bilaterally. Review of the MIP images confirms the above findings CTA NECK FINDINGS Aortic arch: Atherosclerotic calcifications are present at the aortic arch without focal stenosis of the great vessel origins. There is no aneurysm. Right carotid system: The right common carotid artery is within normal limits. Mild atherosclerotic changes are present at bifurcation without significant stenosis. There is moderate tortuosity of the cervical right ICA below the skull base. No significant stenosis present. Left carotid system: Left common  carotid artery is mildly tortuous. Minimal atherosclerotic changes are present the left carotid bifurcation. There is mild irregularity of the distal left cervical internal carotid artery with some beading no significant focal stenosis. Moderate tortuosity is present just below the  skull base. Vertebral arteries: The vertebral arteries are patent bilaterally. The left vertebral artery is dominant. Both vertebral arteries originate from the subclavian arteries without significant stenosis. There is tortuosity in the neck. No focal stenosis is present in the neck. Skeleton: Vertebral body heights alignment maintained. No focal lytic or blastic lesions are present. Other neck: The soft tissues the neck are otherwise unremarkable. Thyroid is normal. Salivary glands are within normal limits bilaterally. Multinodular goiter is present. No dominant lesion is present. Upper chest: A 4 mm right upper lobe of lung nodule is noted. No other focal nodule or mass lesion is present. Thoracic inlet is within normal limits. Review of the MIP images confirms the above findings CTA HEAD FINDINGS Anterior circulation: Atherosclerotic calcifications are present within the cavernous internal carotid arteries bilaterally. There is no significant stenosis from the skull base through the ICA termini bilaterally. The A1 and M1 great vessels are normal. Anterior communicating artery is patent. The MCA bifurcations are intact. There is asymmetric attenuation of left MCA branch vessels within the infarct territory. No discrete proximal stenosis or branch vessel occlusion is evident. Posterior circulation: The left vertebral artery is the dominant vessel. The basilar artery is normal. Right posterior cerebral artery is of fetal type. A mild right P2 segment stenosis is present. No other significant proximal stenosis or occlusion is present. Venous sinuses: Dural sinuses are patent. The straight sinus and deep cerebral veins are intact. Cortical veins are within normal limits. Anatomic variants: Fetal type right posterior cerebral artery. Delayed phase: Delayed phase images demonstrates no pathologic enhancement. Review of the MIP images confirms the above findings IMPRESSION: 1. Acute/subacute left frontal lobe cortical  infarct without hemorrhage. 2. Focal asymmetric decrease in vascularity than left MCA branches associated with the acute infarct. No associated large vessel occlusion is present. 3. Focal vessel irregularity and beading in the left cervical internal carotid artery raises concern for fibromuscular dysplasia. 4. Tortuosity of the distal internal carotid arteries bilaterally. This is nonspecific, but most commonly seen in the setting of chronic hypertension. 5. Mild diffuse distal small vessel disease is present without a significant proximal large vessel occlusion. 6. Atrophy and white matter changes are otherwise within normal limits for age. 7.  Aortic Atherosclerosis (ICD10-I70.0). 8. 4 mm right upper lobe lung nodule. No follow-up needed if patient is low-risk. Non-contrast chest CT can be considered in 12 months if patient is high-risk. This recommendation follows the consensus statement: Guidelines for Management of Incidental Pulmonary Nodules Detected on CT Images: From the Fleischner Society 2017; Radiology 2017; 284:228-243. These results were called by telephone at the time of interpretation on 01/27/2018 at 6:13pm to Dr. Leonel Ramsay, who verbally acknowledged these results. Electronically Signed   By: San Morelle M.D.   On: 01/27/2018 18:39   Ct Angio Neck W Or Wo Contrast  Result Date: 01/27/2018 CLINICAL DATA:  Acute onset of right facial droop and aphasia beginning 8 hours ago. EXAM: CT ANGIOGRAPHY HEAD AND NECK TECHNIQUE: Multidetector CT imaging of the head and neck was performed using the standard protocol during bolus administration of intravenous contrast. Multiplanar CT image reconstructions and MIPs were obtained to evaluate the vascular anatomy. Carotid stenosis measurements (when applicable) are obtained utilizing NASCET criteria, using the distal internal carotid  diameter as the denominator. CONTRAST:  29m ISOVUE-370 IOPAMIDOL (ISOVUE-370) INJECTION 76% COMPARISON:  None.  FINDINGS: CT HEAD FINDINGS Brain: Acute/subacute nonhemorrhagic infarct is present in the posterior left frontal lobe with some involvement of the precentral gyrus. There is partial effacement of the sulci without midline shift or mass effect on the ventral. No other acute or subacute infarct is present. A remote lacunar infarct is present in the right caudate head. Mild atrophy and white matter changes are otherwise within normal limits for age. The ventricles are proportionate to the degree of atrophy. No significant extra-axial fluid collection is present. The brainstem is normal. Remote lacunar infarcts are present in the right cerebellum. Vascular: A right cages are present within the cavernous internal carotid arteries. There is no hyperdense vessel. Skull: Calvarium is intact. No focal lytic or blastic lesions are present. Asymmetric degenerative changes are noted at the left TMJ. Sinuses: The paranasal sinuses and mastoid air cells are clear. Orbits: Bilateral lens replacements are present. Globes and orbits are within normal limits bilaterally. Review of the MIP images confirms the above findings CTA NECK FINDINGS Aortic arch: Atherosclerotic calcifications are present at the aortic arch without focal stenosis of the great vessel origins. There is no aneurysm. Right carotid system: The right common carotid artery is within normal limits. Mild atherosclerotic changes are present at bifurcation without significant stenosis. There is moderate tortuosity of the cervical right ICA below the skull base. No significant stenosis present. Left carotid system: Left common carotid artery is mildly tortuous. Minimal atherosclerotic changes are present the left carotid bifurcation. There is mild irregularity of the distal left cervical internal carotid artery with some beading no significant focal stenosis. Moderate tortuosity is present just below the skull base. Vertebral arteries: The vertebral arteries are patent  bilaterally. The left vertebral artery is dominant. Both vertebral arteries originate from the subclavian arteries without significant stenosis. There is tortuosity in the neck. No focal stenosis is present in the neck. Skeleton: Vertebral body heights alignment maintained. No focal lytic or blastic lesions are present. Other neck: The soft tissues the neck are otherwise unremarkable. Thyroid is normal. Salivary glands are within normal limits bilaterally. Multinodular goiter is present. No dominant lesion is present. Upper chest: A 4 mm right upper lobe of lung nodule is noted. No other focal nodule or mass lesion is present. Thoracic inlet is within normal limits. Review of the MIP images confirms the above findings CTA HEAD FINDINGS Anterior circulation: Atherosclerotic calcifications are present within the cavernous internal carotid arteries bilaterally. There is no significant stenosis from the skull base through the ICA termini bilaterally. The A1 and M1 great vessels are normal. Anterior communicating artery is patent. The MCA bifurcations are intact. There is asymmetric attenuation of left MCA branch vessels within the infarct territory. No discrete proximal stenosis or branch vessel occlusion is evident. Posterior circulation: The left vertebral artery is the dominant vessel. The basilar artery is normal. Right posterior cerebral artery is of fetal type. A mild right P2 segment stenosis is present. No other significant proximal stenosis or occlusion is present. Venous sinuses: Dural sinuses are patent. The straight sinus and deep cerebral veins are intact. Cortical veins are within normal limits. Anatomic variants: Fetal type right posterior cerebral artery. Delayed phase: Delayed phase images demonstrates no pathologic enhancement. Review of the MIP images confirms the above findings IMPRESSION: 1. Acute/subacute left frontal lobe cortical infarct without hemorrhage. 2. Focal asymmetric decrease in  vascularity than left MCA branches associated with the  acute infarct. No associated large vessel occlusion is present. 3. Focal vessel irregularity and beading in the left cervical internal carotid artery raises concern for fibromuscular dysplasia. 4. Tortuosity of the distal internal carotid arteries bilaterally. This is nonspecific, but most commonly seen in the setting of chronic hypertension. 5. Mild diffuse distal small vessel disease is present without a significant proximal large vessel occlusion. 6. Atrophy and white matter changes are otherwise within normal limits for age. 7.  Aortic Atherosclerosis (ICD10-I70.0). 8. 4 mm right upper lobe lung nodule. No follow-up needed if patient is low-risk. Non-contrast chest CT can be considered in 12 months if patient is high-risk. This recommendation follows the consensus statement: Guidelines for Management of Incidental Pulmonary Nodules Detected on CT Images: From the Fleischner Society 2017; Radiology 2017; 284:228-243. These results were called by telephone at the time of interpretation on 01/27/2018 at 6:13pm to Dr. Leonel Ramsay, who verbally acknowledged these results. Electronically Signed   By: San Morelle M.D.   On: 01/27/2018 18:39   Dg Chest Port 1 View  Result Date: 01/27/2018 CLINICAL DATA:  Weakness EXAM: PORTABLE CHEST 1 VIEW COMPARISON:  09/21/2012 FINDINGS: There is no focal parenchymal opacity. There is no pleural effusion or pneumothorax. There is stable cardiomegaly. 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 The osseous structures are unremarkable. IMPRESSION: No active disease. Electronically Signed   By: Kathreen Devoid   On: 01/27/2018 16:54    Subjective: Unable to assess  Discharge Exam: Vitals:   01/30/18 1951 01/31/18 0406  BP: (!) 211/90 (!) 163/98  Pulse: (!) 111 99  Resp: 16 16  Temp: 98.8 F (37.1 C)   SpO2: 97% 97%   Vitals:   01/30/18 1227 01/30/18 1708 01/30/18 1951 01/31/18 0406  BP: (!)  167/76 (!) 173/82 (!) 211/90 (!) 163/98  Pulse:  91 (!) 111 99  Resp: '14 20 16 16  ' Temp: 99 F (37.2 C) 98.6 F (37 C) 98.8 F (37.1 C)   TempSrc: Oral Oral Oral   SpO2: 97% 97% 97% 97%  Weight:      Height:        General: Pt is alert, awake, not in acute distress Cardiovascular: RRR, S1/S2 +, no rubs, no gallops Respiratory: CTA bilaterally, no wheezing, no rhonchi Abdominal: Soft, NT, ND, bowel sounds + Extremities: no edema, no cyanosis   The results of significant diagnostics from this hospitalization (including imaging, microbiology, ancillary and laboratory) are listed below for reference.     Microbiology: No results found for this or any previous visit (from the past 240 hour(s)).   Labs: BNP (last 3 results) No results for input(s): BNP in the last 8760 hours. Basic Metabolic Panel: Recent Labs  Lab 01/27/18 1624 01/27/18 1957 01/28/18 0339 01/29/18 0508  NA 140 143 143 140  K 4.8 2.9* 2.8* 3.3*  CL 102 102 103 103  CO2  --  23 27 21*  GLUCOSE 113* 107* 112* 117*  BUN '17 12 10 ' 7*  CREATININE 0.80 0.97 0.98 0.96  CALCIUM  --  9.0 8.8* 8.6*  MG  --   --   --  1.7   Liver Function Tests: Recent Labs  Lab 01/27/18 1957  AST 18  ALT 8  ALKPHOS 46  BILITOT 1.1  PROT 6.1*  ALBUMIN 2.8*   No results for input(s): LIPASE, AMYLASE in the last 168 hours. No results for input(s): AMMONIA in the last 168 hours. CBC: Recent Labs  Lab 01/27/18 1624 01/27/18 1957 01/28/18 7356  WBC  --  9.3 9.0  NEUTROABS  --  6.8  --   HGB 12.2 12.0 11.2*  HCT 36.0 38.1 34.3*  MCV  --  99.0 95.5  PLT  --  283 309   Cardiac Enzymes: No results for input(s): CKTOTAL, CKMB, CKMBINDEX, TROPONINI in the last 168 hours. BNP: Invalid input(s): POCBNP CBG: Recent Labs  Lab 01/28/18 1633 01/29/18 0431  GLUCAP 86 100*   D-Dimer No results for input(s): DDIMER in the last 72 hours. Hgb A1c No results for input(s): HGBA1C in the last 72 hours. Lipid Profile No  results for input(s): CHOL, HDL, LDLCALC, TRIG, CHOLHDL, LDLDIRECT in the last 72 hours. Thyroid function studies No results for input(s): TSH, T4TOTAL, T3FREE, THYROIDAB in the last 72 hours.  Invalid input(s): FREET3 Anemia work up No results for input(s): VITAMINB12, FOLATE, FERRITIN, TIBC, IRON, RETICCTPCT in the last 72 hours. Urinalysis    Component Value Date/Time   COLORURINE YELLOW 08/29/2012 1827   APPEARANCEUR CLOUDY (A) 08/29/2012 1827   LABSPEC 1.015 08/29/2012 1827   PHURINE 5.0 08/29/2012 1827   GLUCOSEU NEGATIVE 08/29/2012 1827   HGBUR TRACE (A) 08/29/2012 1827   BILIRUBINUR small (A) 06/07/2017 1741   BILIRUBINUR neg 08/26/2014 1335   KETONESUR trace (5) (A) 06/07/2017 1741   KETONESUR 40 (A) 08/29/2012 1827   PROTEINUR =30 (A) 06/07/2017 1741   PROTEINUR neg 08/26/2014 1335   PROTEINUR 100 (A) 08/29/2012 1827   UROBILINOGEN 1.0 06/07/2017 1741   UROBILINOGEN 0.2 08/29/2012 1827   NITRITE Negative 06/07/2017 1741   NITRITE neg 08/26/2014 1335   NITRITE NEGATIVE 08/29/2012 1827   LEUKOCYTESUR Negative 06/07/2017 1741   Sepsis Labs Invalid input(s): PROCALCITONIN,  WBC,  LACTICIDVEN Microbiology No results found for this or any previous visit (from the past 240 hour(s)).  Time spent: 2mn  SIGNED:   SMarylu Lund MD  [facility] 01/31/2018, 11:20 AM  If 7PM-7AM, please contact night-coverage [email or web address] Password [email or web address]

## 2018-02-17 DEATH — deceased

## 2019-06-26 IMAGING — DX DG CHEST 1V PORT
1 series · 1 of 1 positions shown · non-contrast
Comparison: 09/21/2012

CLINICAL DATA: Weakness

EXAM:
PORTABLE CHEST 1 VIEW

[chest ap]
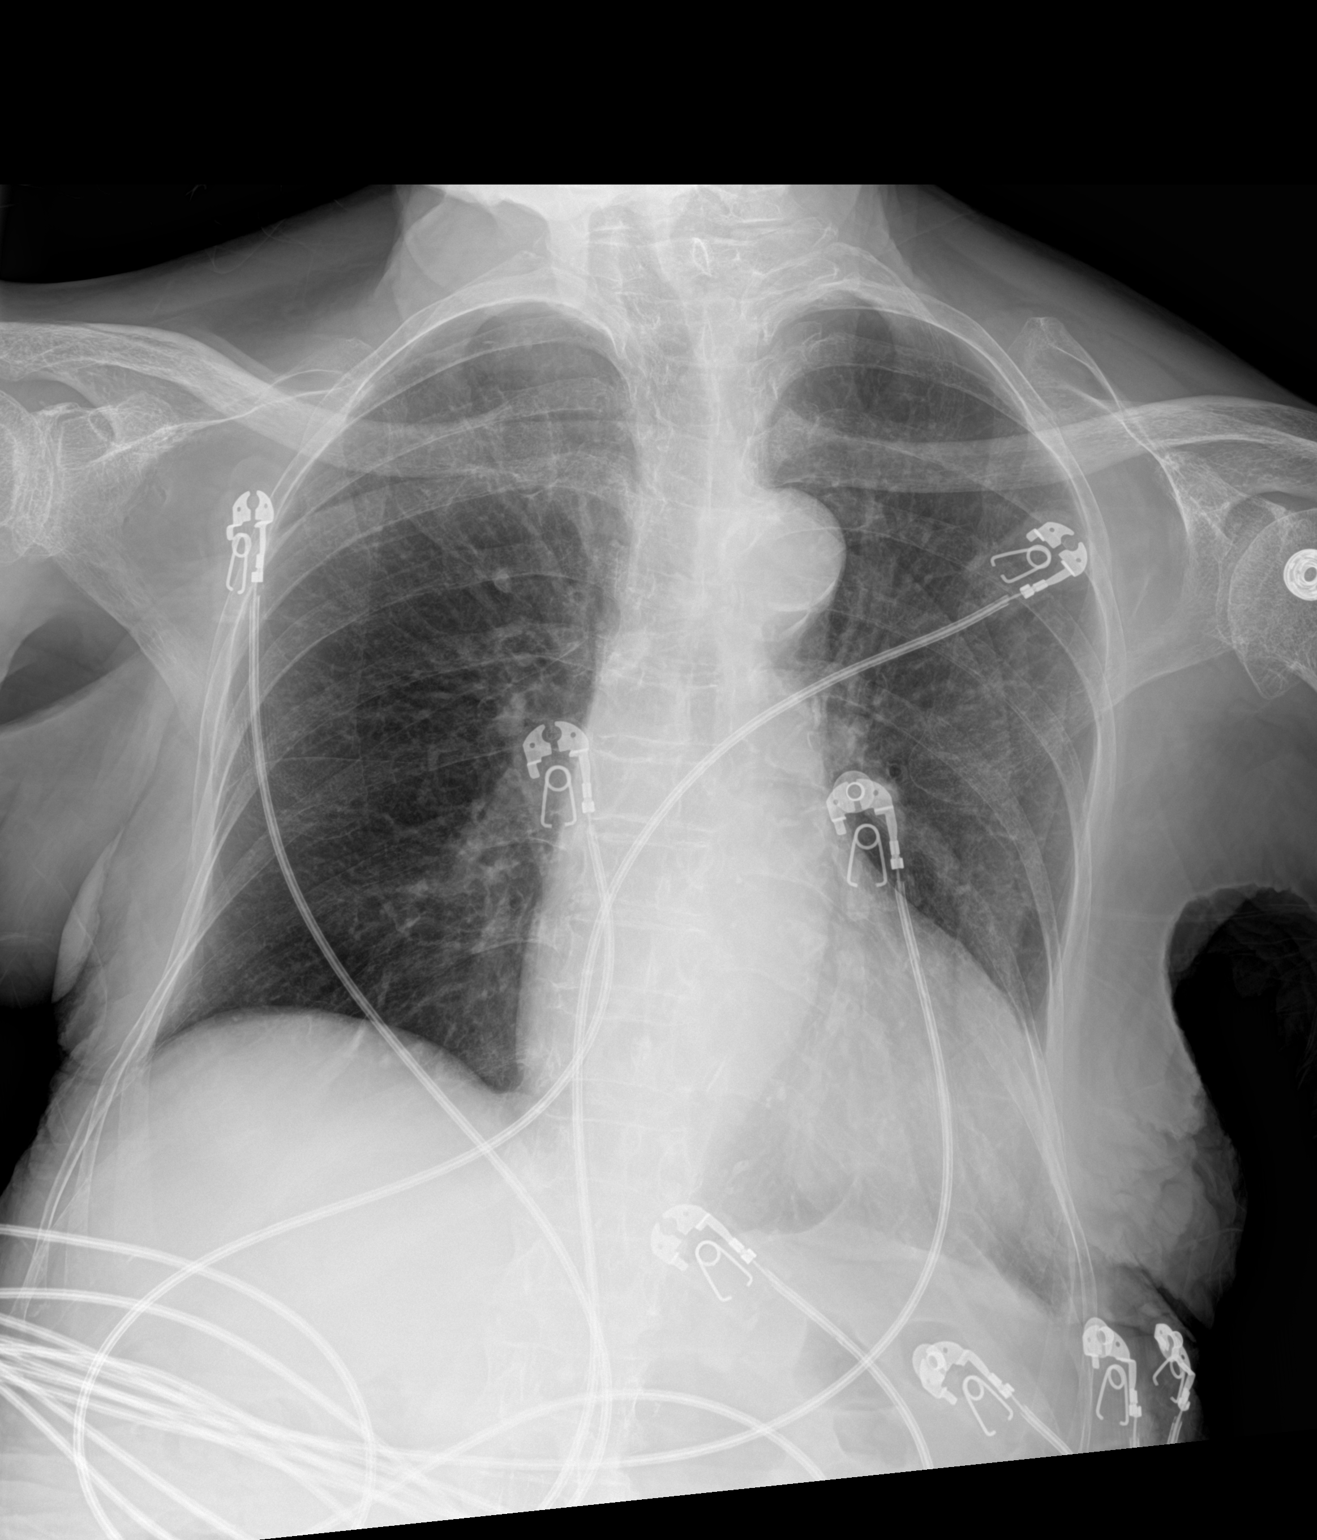

[1 of 1 positions shown; findings below may reference images not displayed]

FINDINGS: There is no focal parenchymal opacity. There is no pleural effusion
or pneumothorax. There is stable cardiomegaly. There is
osteoarthritis of bilateral glenohumeral joints.

The osseous structures are unremarkable.
IMPRESSION: No active disease.

## 2019-06-26 IMAGING — CT CT ANGIO NECK
1 of 12 series · 5 of 33 positions shown · IV contrast (APPLIED)
Comparison: None.

CLINICAL DATA: Acute onset of right facial droop and aphasia
beginning 8 hours ago.

EXAM:
CT ANGIOGRAPHY HEAD AND NECK
TECHNIQUE: Multidetector CT imaging of the head and neck was performed using
the standard protocol during bolus administration of intravenous
contrast. Multiplanar CT image reconstructions and MIPs were
obtained to evaluate the vascular anatomy. Carotid stenosis
measurements (when applicable) are obtained utilizing NASCET
criteria, using the distal internal carotid diameter as the
denominator.
CONTRAST:  50mL HMSGRS-N3Z IOPAMIDOL (HMSGRS-N3Z) INJECTION 76%

[Series 11: ax thins · axial · 0.42mm/px · z∈[-276,-68]mm · 5 of 313 slices shown]
[im 53/313  soft-tissue]
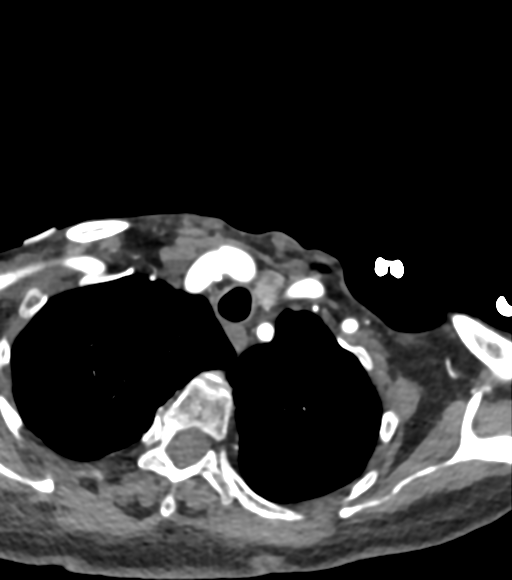
[im 105/313  bone]
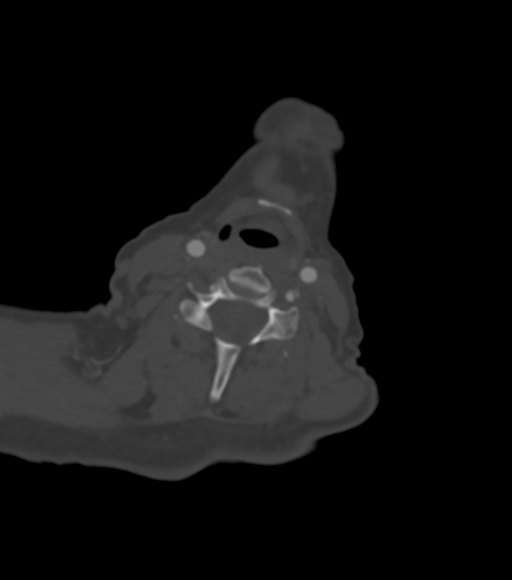
[im 157/313  soft-tissue]
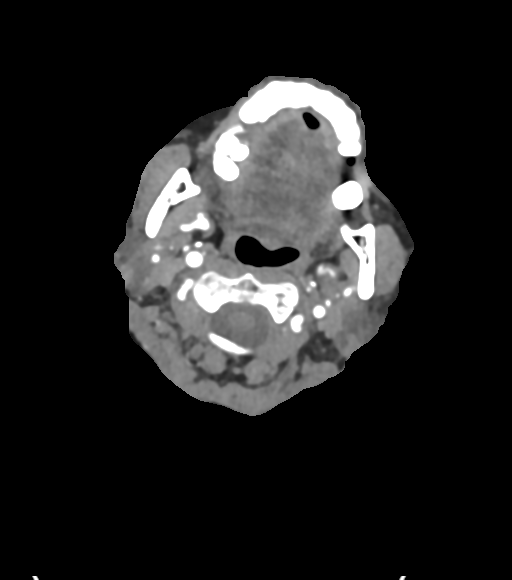
[im 209/313  bone]
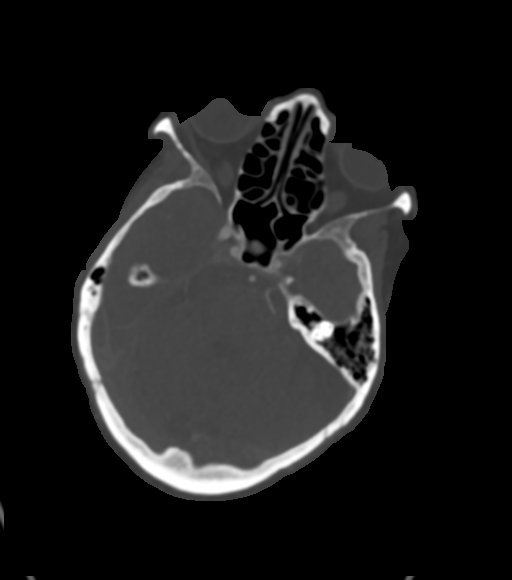
[im 261/313  soft-tissue]
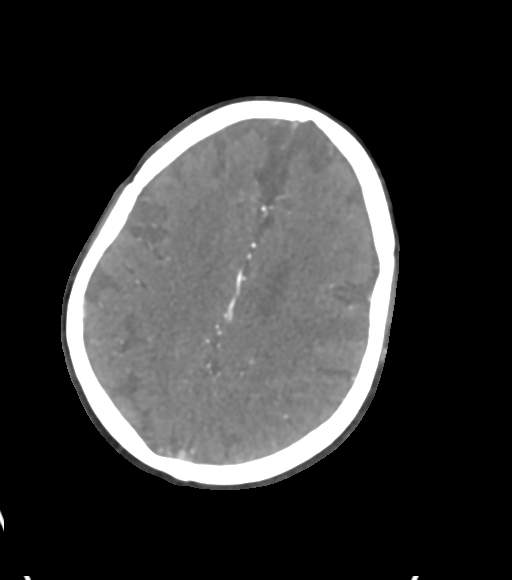

[5 of 33 positions shown; findings below may reference images not displayed]

FINDINGS: CT HEAD FINDINGS

Brain: Acute/subacute nonhemorrhagic infarct is present in the
posterior left frontal lobe with some involvement of the precentral
gyrus. There is partial effacement of the sulci without midline
shift or mass effect on the ventral. No other acute or subacute
infarct is present.

A remote lacunar infarct is present in the right caudate head. Mild
atrophy and white matter changes are otherwise within normal limits
for age. The ventricles are proportionate to the degree of atrophy.
No significant extra-axial fluid collection is present.

The brainstem is normal. Remote lacunar infarcts are present in the
right cerebellum.

Vascular: A right cages are present within the cavernous internal
carotid arteries. There is no hyperdense vessel.

Skull: Calvarium is intact. No focal lytic or blastic lesions are
present. Asymmetric degenerative changes are noted at the left TMJ.

Sinuses: The paranasal sinuses and mastoid air cells are clear.

Orbits: Bilateral lens replacements are present. Globes and orbits
are within normal limits bilaterally.

Review of the MIP images confirms the above findings

CTA NECK FINDINGS

Aortic arch: Atherosclerotic calcifications are present at the
aortic arch without focal stenosis of the great vessel origins.
There is no aneurysm.

Right carotid system: The right common carotid artery is within
normal limits. Mild atherosclerotic changes are present at
bifurcation without significant stenosis. There is moderate
tortuosity of the cervical right ICA below the skull base. No
significant stenosis present.

Left carotid system: Left common carotid artery is mildly tortuous.
Minimal atherosclerotic changes are present the left carotid
bifurcation. There is mild irregularity of the distal left cervical
internal carotid artery with some beading no significant focal
stenosis. Moderate tortuosity is present just below the skull base.

Vertebral arteries: The vertebral arteries are patent bilaterally.
The left vertebral artery is dominant. Both vertebral arteries
originate from the subclavian arteries without significant stenosis.
There is tortuosity in the neck. No focal stenosis is present in the
neck.

Skeleton: Vertebral body heights alignment maintained. No focal
lytic or blastic lesions are present.

Other neck: The soft tissues the neck are otherwise unremarkable.
Thyroid is normal. Salivary glands are within normal limits
bilaterally. Multinodular goiter is present. No dominant lesion is
present.

Upper chest: A 4 mm right upper lobe of lung nodule is noted. No
other focal nodule or mass lesion is present. Thoracic inlet is
within normal limits.

Review of the MIP images confirms the above findings

CTA HEAD FINDINGS

Anterior circulation: Atherosclerotic calcifications are present
within the cavernous internal carotid arteries bilaterally. There is
no significant stenosis from the skull base through the ICA termini
bilaterally. The A1 and M1 great vessels are normal. Anterior
communicating artery is patent. The MCA bifurcations are intact.
There is asymmetric attenuation of left MCA branch vessels within
the infarct territory. No discrete proximal stenosis or branch
vessel occlusion is evident.

Posterior circulation: The left vertebral artery is the dominant
vessel. The basilar artery is normal. Right posterior cerebral
artery is of fetal type. A mild right P2 segment stenosis is
present. No other significant proximal stenosis or occlusion is
present.

Venous sinuses: Dural sinuses are patent. The straight sinus and
deep cerebral veins are intact. Cortical veins are within normal
limits.

Anatomic variants: Fetal type right posterior cerebral artery.

Delayed phase: Delayed phase images demonstrates no pathologic
enhancement.

Review of the MIP images confirms the above findings
IMPRESSION: 1. Acute/subacute left frontal lobe cortical infarct without
hemorrhage.
2. Focal asymmetric decrease in vascularity than left MCA branches
associated with the acute infarct. No associated large vessel
occlusion is present.
3. Focal vessel irregularity and beading in the left cervical
internal carotid artery raises concern for fibromuscular dysplasia.
4. Tortuosity of the distal internal carotid arteries bilaterally.
This is nonspecific, but most commonly seen in the setting of
chronic hypertension.
5. Mild diffuse distal small vessel disease is present without a
significant proximal large vessel occlusion.
6. Atrophy and white matter changes are otherwise within normal
limits for age.
7.  Aortic Atherosclerosis ([id number]).
8. 4 mm right upper lobe lung nodule. No follow-up needed if patient
is low-risk. Non-contrast chest CT can be considered in 12 months if
patient is high-risk. This recommendation follows the consensus
statement: Guidelines for Management of Incidental Pulmonary Nodules
Detected on CT Images: From the [HOSPITAL] 8107; Radiology
8107; [DATE].

These results were called by telephone at the time of interpretation
on 01/27/2018 at [DATE] to Dr. SORIN, who verbally acknowledged
these results.
